# Patient Record
Sex: Female | Born: 1985 | Race: White | Hispanic: No | Marital: Single | Smoking: Current every day smoker
Health system: Southern US, Community
[De-identification: ages and names within clinical notes are randomized; demographics above are authoritative.]

## PROBLEM LIST (undated history)

## (undated) DIAGNOSIS — Z141 Cystic fibrosis carrier: Secondary | ICD-10-CM

## (undated) DIAGNOSIS — Z789 Other specified health status: Secondary | ICD-10-CM

## (undated) DIAGNOSIS — I10 Essential (primary) hypertension: Secondary | ICD-10-CM

## (undated) DIAGNOSIS — B009 Herpesviral infection, unspecified: Secondary | ICD-10-CM

## (undated) HISTORY — DX: Essential (primary) hypertension: I10

## (undated) HISTORY — DX: Cystic fibrosis carrier: Z14.1

## (undated) HISTORY — DX: Herpesviral infection, unspecified: B00.9

---

## 2005-04-15 ENCOUNTER — Emergency Department: Payer: Self-pay | Admitting: Emergency Medicine

## 2006-05-20 ENCOUNTER — Ambulatory Visit: Payer: Self-pay | Admitting: Gynecology

## 2006-05-25 ENCOUNTER — Ambulatory Visit (HOSPITAL_COMMUNITY): Admission: RE | Admit: 2006-05-25 | Discharge: 2006-05-25 | Payer: Self-pay

## 2006-06-06 ENCOUNTER — Ambulatory Visit: Payer: Self-pay | Admitting: Gynecology

## 2006-06-27 ENCOUNTER — Ambulatory Visit: Payer: Self-pay | Admitting: Gynecology

## 2006-07-25 ENCOUNTER — Ambulatory Visit: Payer: Self-pay | Admitting: Gynecology

## 2006-08-16 ENCOUNTER — Ambulatory Visit: Payer: Self-pay | Admitting: Family Medicine

## 2006-08-23 ENCOUNTER — Ambulatory Visit (HOSPITAL_COMMUNITY): Admission: RE | Admit: 2006-08-23 | Discharge: 2006-08-23 | Payer: Self-pay | Admitting: Gynecology

## 2006-09-13 ENCOUNTER — Ambulatory Visit: Payer: Self-pay | Admitting: Family Medicine

## 2006-10-11 ENCOUNTER — Ambulatory Visit: Payer: Self-pay | Admitting: Family Medicine

## 2006-10-19 ENCOUNTER — Ambulatory Visit: Payer: Self-pay | Admitting: Family Medicine

## 2006-11-01 ENCOUNTER — Ambulatory Visit: Payer: Self-pay | Admitting: Family Medicine

## 2006-11-15 ENCOUNTER — Ambulatory Visit: Payer: Self-pay | Admitting: Family Medicine

## 2006-11-29 ENCOUNTER — Ambulatory Visit: Payer: Self-pay | Admitting: Family Medicine

## 2006-12-21 ENCOUNTER — Ambulatory Visit: Payer: Self-pay | Admitting: Obstetrics & Gynecology

## 2006-12-27 ENCOUNTER — Ambulatory Visit: Payer: Self-pay | Admitting: Family Medicine

## 2007-01-01 ENCOUNTER — Inpatient Hospital Stay (HOSPITAL_COMMUNITY): Admission: AD | Admit: 2007-01-01 | Discharge: 2007-01-01 | Payer: Self-pay | Admitting: Gynecology

## 2007-01-02 ENCOUNTER — Inpatient Hospital Stay (HOSPITAL_COMMUNITY): Admission: AD | Admit: 2007-01-02 | Discharge: 2007-01-04 | Payer: Self-pay | Admitting: Gynecology

## 2007-01-02 ENCOUNTER — Ambulatory Visit: Payer: Self-pay | Admitting: Obstetrics & Gynecology

## 2007-02-07 ENCOUNTER — Ambulatory Visit: Payer: Self-pay | Admitting: Family Medicine

## 2007-02-07 ENCOUNTER — Encounter: Payer: Self-pay | Admitting: Family Medicine

## 2007-02-07 ENCOUNTER — Encounter (INDEPENDENT_AMBULATORY_CARE_PROVIDER_SITE_OTHER): Payer: Self-pay | Admitting: Specialist

## 2007-05-02 ENCOUNTER — Ambulatory Visit: Payer: Self-pay | Admitting: Family Medicine

## 2008-02-15 IMAGING — US US OB TRANSVAGINAL MODIFY
1 series · 18 of 28 positions shown · non-contrast
Comparison: none

CLINICAL DATA: Positive pregnancy test.  Dating and viability.

[Series 1: us ob transvaginal modify · 18 of 41 slices shown]
[im 1/41]
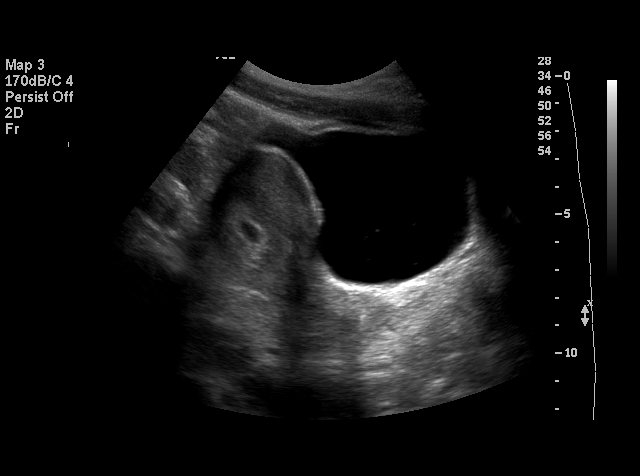
[im 3/41]
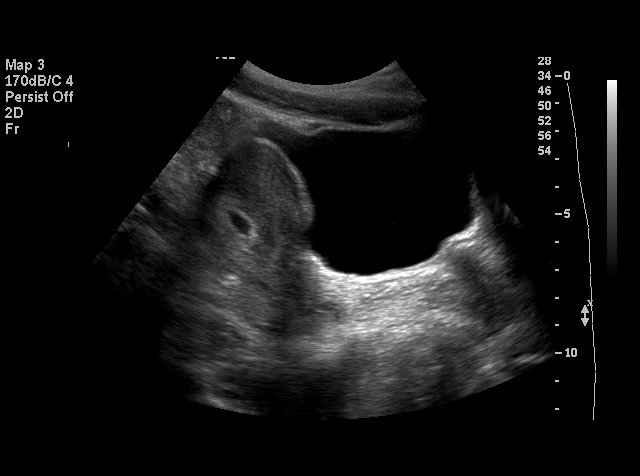
[im 5/41]
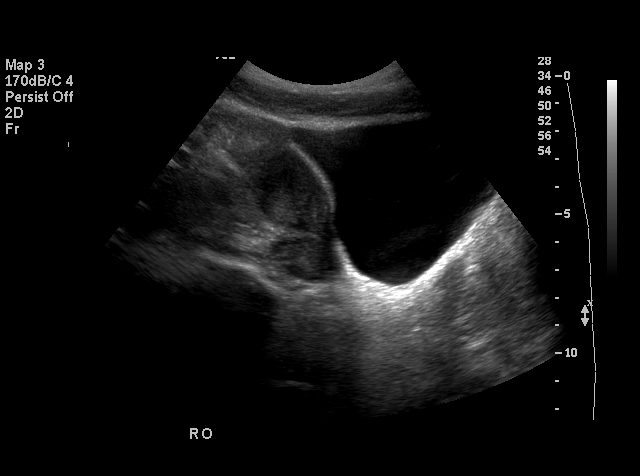
[im 8/41]
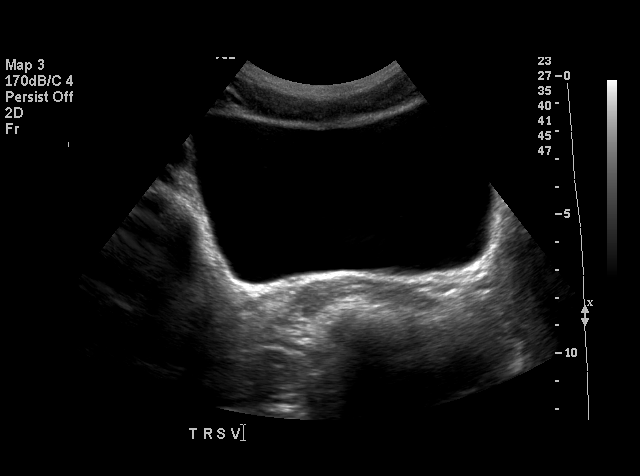
[im 11/41]
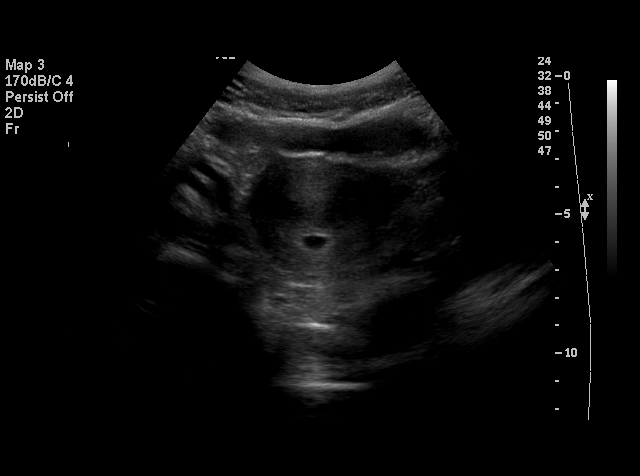
[im 12/41]
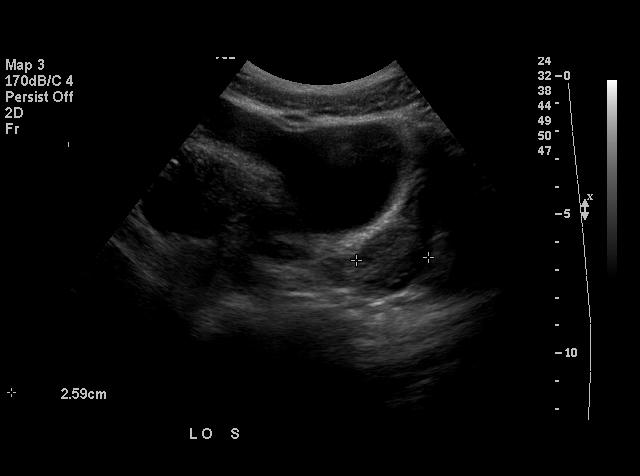
[im 15/41]
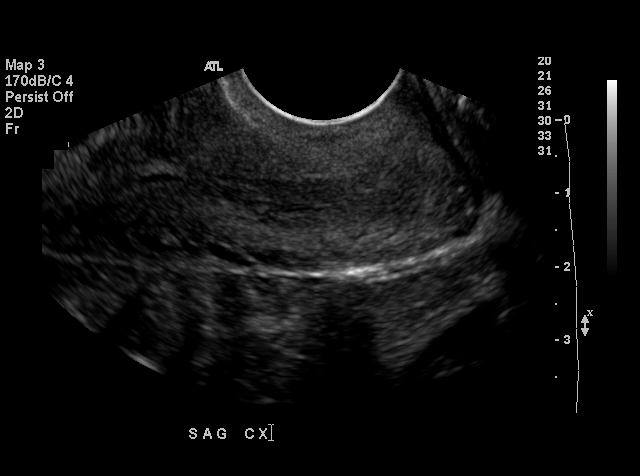
[im 17/41]
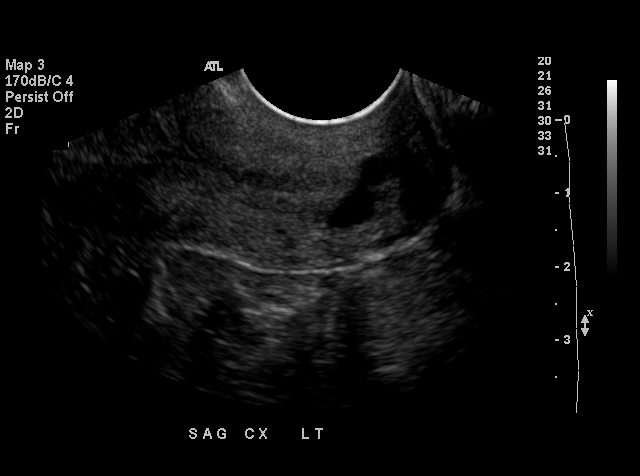
[im 20/41]
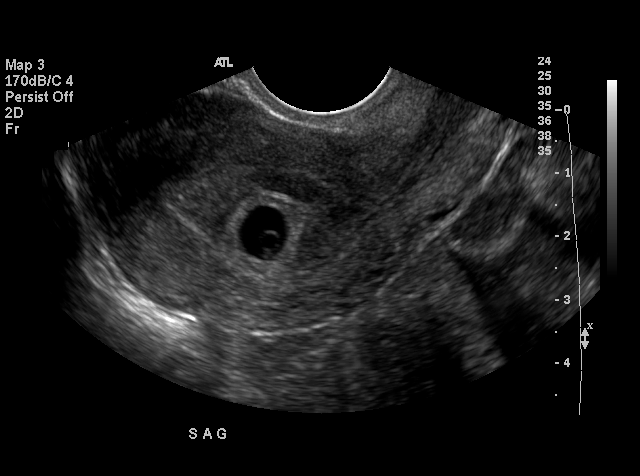
[im 21/41]
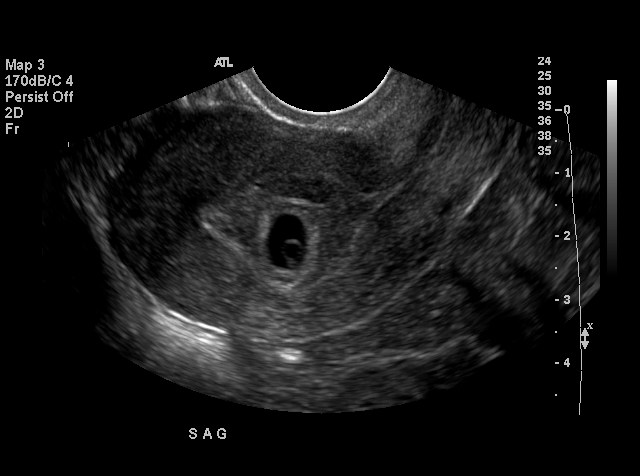
[im 24/41]
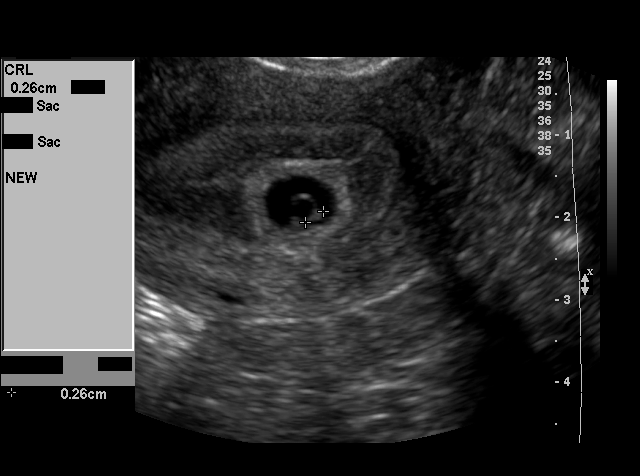
[im 26/41]
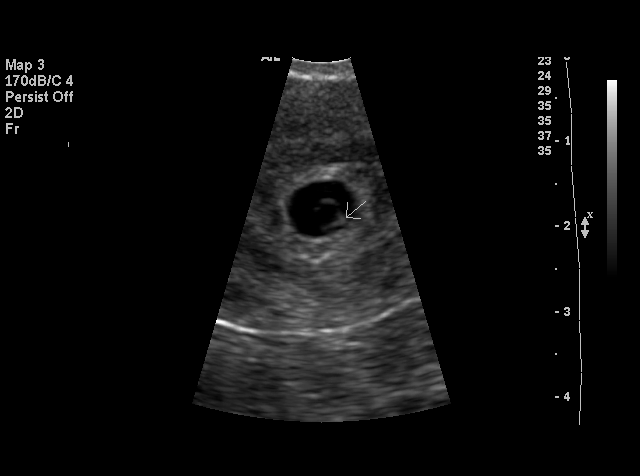
[im 29/41]
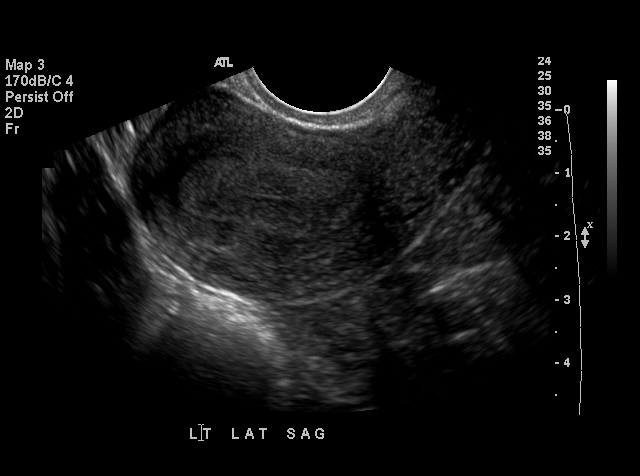
[im 32/41]
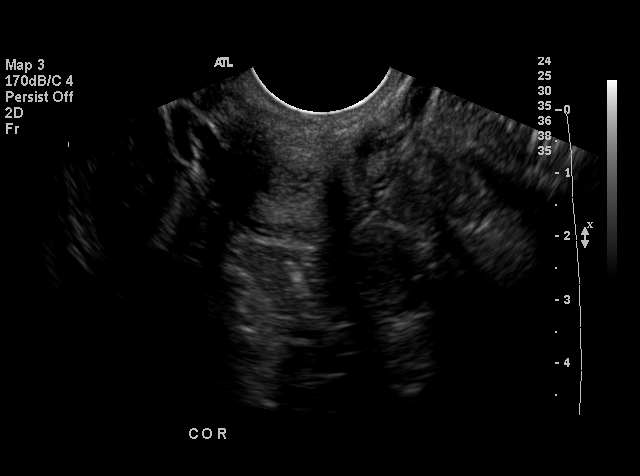
[im 33/41]
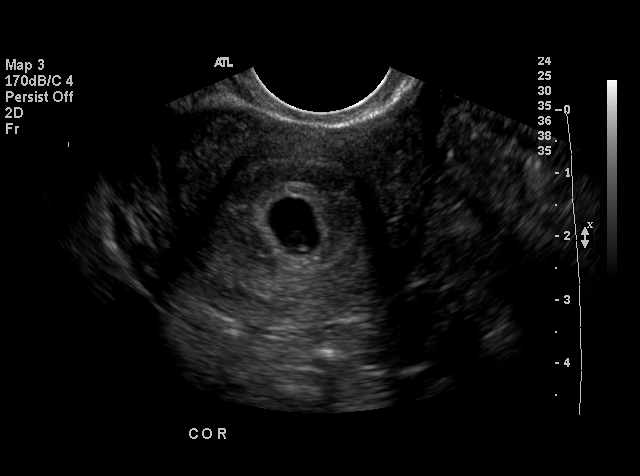
[im 36/41]
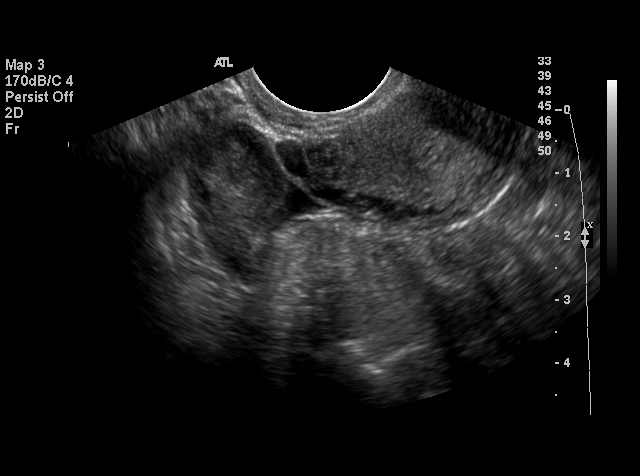
[im 38/41]
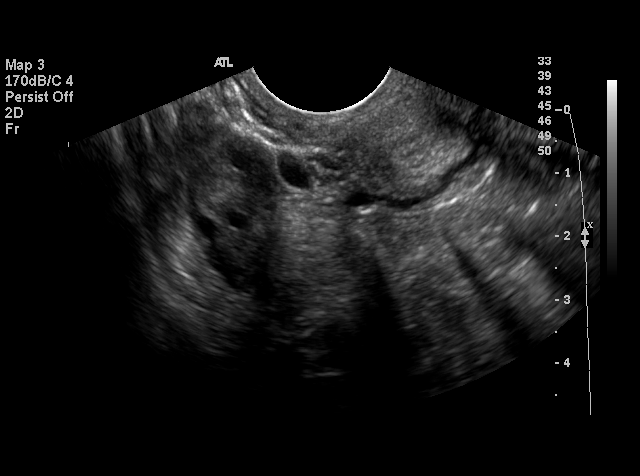
[im 41/41]
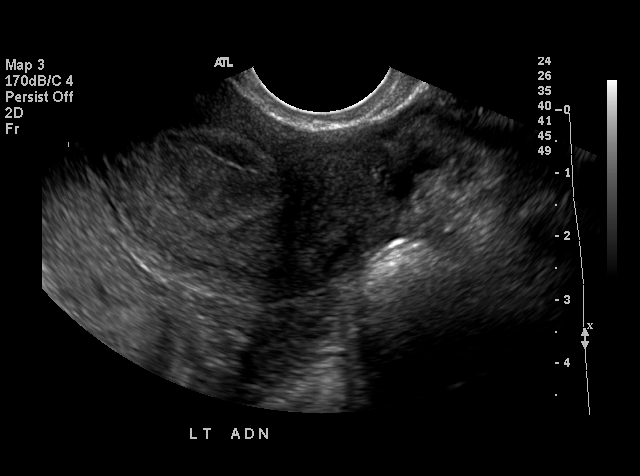

[18 of 28 positions shown; findings below may reference images not displayed]

OBSTETRICAL ULTRASOUND AND TRANSVAGINAL OB US:

 Number of Fetuses: 1
 Heart Rate:  100 bpm
 CRL:  .26 cm  5 w 6 d

 Ultrasound EDC:  01/19/07

 Fetal anatomy could not be evaluated due to the early gestational age.

 MATERNAL UTERINE AND ADNEXAL FINDINGS
 Cervix:  Closed.  Not evaluated < 16 weeks
IMPRESSION: 1.  Single living intrauterine gestation identified.  Crown rump length is 2.6 mm which estimates a 5 week 6 day gestational age.  
 2.  Ovaries are unremarkable.  There is no free fluid noted in the cul-de-sac.

## 2009-01-14 ENCOUNTER — Encounter: Payer: Self-pay | Admitting: Family Medicine

## 2009-01-14 ENCOUNTER — Ambulatory Visit: Payer: Self-pay | Admitting: Nurse Practitioner

## 2009-01-14 LAB — CONVERTED CEMR LAB
Antibody Screen: NEGATIVE
Basophils Absolute: 0 10*3/uL (ref 0.0–0.1)
Eosinophils Relative: 2 % (ref 0–5)
HCT: 43.9 % (ref 36.0–46.0)
Hepatitis B Surface Ag: NEGATIVE
Lymphocytes Relative: 28 % (ref 12–46)
MCV: 88.3 fL (ref 78.0–100.0)
RBC: 4.97 M/uL (ref 3.87–5.11)
Rh Type: POSITIVE
Rubella: 181.8 intl units/mL — ABNORMAL HIGH

## 2009-01-17 ENCOUNTER — Ambulatory Visit: Payer: Self-pay | Admitting: Family Medicine

## 2009-01-21 ENCOUNTER — Encounter: Payer: Self-pay | Admitting: Family Medicine

## 2009-01-28 ENCOUNTER — Encounter: Payer: Self-pay | Admitting: Family Medicine

## 2009-01-28 ENCOUNTER — Ambulatory Visit: Payer: Self-pay | Admitting: Nurse Practitioner

## 2009-01-28 LAB — CONVERTED CEMR LAB: hCG, Beta Chain, Quant, S: 946.1 milliintl units/mL

## 2009-01-30 ENCOUNTER — Encounter: Payer: Self-pay | Admitting: Family Medicine

## 2009-01-30 LAB — CONVERTED CEMR LAB: hCG, Beta Chain, Quant, S: 2814.6 milliintl units/mL

## 2009-02-06 ENCOUNTER — Ambulatory Visit: Payer: Self-pay | Admitting: Obstetrics & Gynecology

## 2009-02-21 ENCOUNTER — Ambulatory Visit (HOSPITAL_COMMUNITY): Admission: RE | Admit: 2009-02-21 | Discharge: 2009-02-21 | Payer: Self-pay | Admitting: Obstetrics and Gynecology

## 2009-03-06 ENCOUNTER — Ambulatory Visit: Payer: Self-pay | Admitting: Obstetrics and Gynecology

## 2009-04-09 ENCOUNTER — Ambulatory Visit: Payer: Self-pay | Admitting: Obstetrics and Gynecology

## 2009-04-09 ENCOUNTER — Encounter: Payer: Self-pay | Admitting: Family Medicine

## 2009-04-09 LAB — CONVERTED CEMR LAB
Hemoglobin: 13 g/dL (ref 12.0–15.0)
MCV: 90.8 fL (ref 78.0–100.0)
Platelets: 273 10*3/uL (ref 150–400)
RBC: 4.33 M/uL (ref 3.87–5.11)
RDW: 13.1 % (ref 11.5–15.5)
WBC: 12.5 10*3/uL — ABNORMAL HIGH (ref 4.0–10.5)

## 2009-04-29 ENCOUNTER — Ambulatory Visit (HOSPITAL_COMMUNITY): Admission: RE | Admit: 2009-04-29 | Discharge: 2009-04-29 | Payer: Self-pay | Admitting: Family Medicine

## 2009-05-05 ENCOUNTER — Ambulatory Visit: Payer: Self-pay | Admitting: Obstetrics & Gynecology

## 2009-05-05 ENCOUNTER — Encounter: Payer: Self-pay | Admitting: Family Medicine

## 2009-05-08 ENCOUNTER — Ambulatory Visit: Payer: Self-pay | Admitting: Obstetrics & Gynecology

## 2009-05-08 ENCOUNTER — Encounter: Payer: Self-pay | Admitting: Family Medicine

## 2009-06-03 ENCOUNTER — Ambulatory Visit: Payer: Self-pay | Admitting: Obstetrics & Gynecology

## 2009-07-02 ENCOUNTER — Ambulatory Visit: Payer: Self-pay | Admitting: Obstetrics & Gynecology

## 2009-07-02 ENCOUNTER — Encounter: Payer: Self-pay | Admitting: Family Medicine

## 2009-07-02 LAB — CONVERTED CEMR LAB
HCT: 35.5 % — ABNORMAL LOW (ref 36.0–46.0)
Hemoglobin: 11.5 g/dL — ABNORMAL LOW (ref 12.0–15.0)
MCHC: 32.4 g/dL (ref 30.0–36.0)
RBC: 3.85 M/uL — ABNORMAL LOW (ref 3.87–5.11)

## 2009-07-10 ENCOUNTER — Ambulatory Visit: Payer: Self-pay | Admitting: Obstetrics and Gynecology

## 2009-07-17 ENCOUNTER — Ambulatory Visit: Payer: Self-pay | Admitting: Obstetrics & Gynecology

## 2009-08-07 ENCOUNTER — Ambulatory Visit: Payer: Self-pay | Admitting: Obstetrics & Gynecology

## 2009-08-20 ENCOUNTER — Ambulatory Visit: Payer: Self-pay | Admitting: Family Medicine

## 2009-09-03 ENCOUNTER — Ambulatory Visit: Payer: Self-pay | Admitting: Obstetrics & Gynecology

## 2009-09-09 ENCOUNTER — Ambulatory Visit: Payer: Self-pay | Admitting: Obstetrics & Gynecology

## 2009-09-17 ENCOUNTER — Ambulatory Visit: Payer: Self-pay | Admitting: Obstetrics & Gynecology

## 2009-09-22 ENCOUNTER — Inpatient Hospital Stay (HOSPITAL_COMMUNITY): Admission: AD | Admit: 2009-09-22 | Discharge: 2009-09-24 | Payer: Self-pay | Admitting: Obstetrics & Gynecology

## 2009-09-22 ENCOUNTER — Ambulatory Visit: Payer: Self-pay | Admitting: Obstetrics & Gynecology

## 2009-09-22 ENCOUNTER — Ambulatory Visit: Payer: Self-pay | Admitting: Obstetrics and Gynecology

## 2011-03-25 LAB — COMPREHENSIVE METABOLIC PANEL
ALT: 10 U/L (ref 0–35)
AST: 17 U/L (ref 0–37)
CO2: 20 mEq/L (ref 19–32)
GFR calc Af Amer: >60 mL/min (ref >60)
Potassium: 4.1 mEq/L (ref 3.5–5.1)
Total Bilirubin: 0.4 mg/dL (ref 0.3–1.2)
Total Protein: 5.5 g/dL — ABNORMAL LOW (ref 6.0–8.3)

## 2011-03-25 LAB — CBC
HCT: 32.9 % — ABNORMAL LOW (ref 36.0–46.0)
Hemoglobin: 11.1 g/dL — ABNORMAL LOW (ref 12.0–15.0)
MCHC: 33.3 g/dL (ref 30.0–36.0)
MCHC: 33.6 g/dL (ref 30.0–36.0)
Platelets: 224 10*3/uL (ref 150–400)
RBC: 3.78 MIL/uL — ABNORMAL LOW (ref 3.87–5.11)
RBC: 4.15 MIL/uL (ref 3.87–5.11)
RDW: 13.4 % (ref 11.5–15.5)

## 2011-03-25 LAB — RPR: RPR Ser Ql: NONREACTIVE

## 2011-03-25 LAB — URINALYSIS, DIPSTICK ONLY
Glucose, UA: NEGATIVE mg/dL
Protein, ur: NEGATIVE mg/dL
pH: 7.5 (ref 5.0–8.0)

## 2011-05-04 NOTE — Assessment & Plan Note (Signed)
Whitney Rodriguez, Whitney Rodriguez              ACCOUNT NO.:  [id number]   MEDICAL RECORD NO.:  [id number]          PATIENT TYPE:  POB   LOCATION:  [facility] at [facility]         FACILITY:  [facility]   PHYSICIAN:  Tinnie Gens, MD        DATE OF BIRTH:  August 12, 1986   DATE OF SERVICE:  02/07/2007                                  CLINIC NOTE   HISTORY OF PRESENT ILLNESS:  The patient is a 25 year old gravida 3,  para 1-0-2-1 __________ vaginal delivery of a female infant, 7 pounds 3  ounces.  Her delivery went well.  She is nursing.  She is supposed to go  back to work on Monday.  I gave her birth control; she is not sexually  active.  Her mood is good.  Weight loss has been approximately 23  pounds; she has about 25 to go.  The patient has joined a gym; it seems  to be working out and she feels well.  She has not had a cycle since  delivery.  Her last Pap was in 2006.   PHYSICAL EXAMINATION:  On exam, her vitals are as noted in the chart.  She is a well-developed, well-nourished female in no acute distress.  ABDOMEN:  Soft, nontender, nondistended.  GU:  Normal external genitalia.  Vagina is pink and rugated.  Cervix is  parous, without lesion.  The cervix is closed.  The uterus is small and  inverted.  Adnexa without mass or tenderness.   IMPRESSION:  1. Postpartum check.  2. Breast feeding.   PLAN:  1. Pap smear today.  Start oral contraceptives.  2. An outlined discussion was had with the patient regarding birth      control between IUD, Nuvaring, Ortho Evra.  The patient seems to      understand all of this and would like to start with combination      oral contraceptives, but will let us know if she changes her mind      about that.  3. Also discussed continued breast feeding as an implication of her      health of her and her baby.           ______________________________  Tinnie Gens, MD     TP/MEDQ  D:  02/07/2007  T:  02/07/2007  Job:  [id number]

## 2012-12-20 NOTE — L&D Delivery Note (Signed)
Delivery Note At 2:41 AM a viable female was delivered via Vaginal, Spontaneous Delivery (Presentation: Middle Occiput Anterior).  APGAR: 7, 9; weight pending .   Placenta status: Intact, Spontaneous.  Cord: 3 vessels with the following complications: None.  Cord pH: sent  Anesthesia: Spinal  Episiotomy: None Lacerations: Periurethral Suture Repair: none Est. Blood Loss (mL): 250  Mom to postpartum.  Baby to nursery-stable. Pt permitted to labor down with good effort. Delivered a viable baby boy after 2 hard pushes with instantaneous cry. Placenta was spontaneously delivered after manual traction. Delivery was complicated by periurethral tear that was hemostatic. After discussion with pt, plan to allow skin to reapproximate without use of suture. EBL approx 250. Pt and baby both hemodynamically stable for postpartum transfer.   Anselm Lis 10/09/2013, 3:45 AM  I have seen and examined this patient and agree with above documentation in the resident's note. Pt desires postpartum tubal and 30 day papers are signed. OR notified and will post the case.   Rulon Abide, M.D. [facility] Fellow 10/09/2013 3:58 AM

## 2013-03-08 ENCOUNTER — Encounter: Payer: Self-pay | Admitting: *Deleted

## 2013-03-08 ENCOUNTER — Ambulatory Visit (INDEPENDENT_AMBULATORY_CARE_PROVIDER_SITE_OTHER): Payer: Commercial Indemnity | Admitting: *Deleted

## 2013-03-08 VITALS — BP 125/85 | Ht 61.0 in | Wt 150.0 lb

## 2013-03-08 DIAGNOSIS — Z3481 Encounter for supervision of other normal pregnancy, first trimester: Secondary | ICD-10-CM

## 2013-03-08 DIAGNOSIS — Z3480 Encounter for supervision of other normal pregnancy, unspecified trimester: Secondary | ICD-10-CM

## 2013-03-09 LAB — OBSTETRIC PANEL
Basophils Relative: 0 % (ref 0–1)
Hemoglobin: 16 g/dL — ABNORMAL HIGH (ref 12.0–15.0)
Hepatitis B Surface Ag: NEGATIVE
Lymphs Abs: 2 10*3/uL (ref 0.7–4.0)
MCH: 30.9 pg (ref 26.0–34.0)
MCHC: 34.8 g/dL (ref 30.0–36.0)
MCV: 88.8 fL (ref 78.0–100.0)
Monocytes Absolute: 0.8 10*3/uL (ref 0.1–1.0)
Monocytes Relative: 10 % (ref 3–12)
Neutrophils Relative %: 62 % (ref 43–77)
Platelets: 288 10*3/uL (ref 150–400)
RBC: 5.18 MIL/uL — ABNORMAL HIGH (ref 3.87–5.11)
RDW: 13.6 % (ref 11.5–15.5)
Rubella: 5.52 Index — ABNORMAL HIGH (ref <0.90)
WBC: 7.4 10*3/uL (ref 4.0–10.5)

## 2013-03-10 LAB — CULTURE, OB URINE: Colony Count: 70000

## 2013-03-13 ENCOUNTER — Telehealth: Payer: Self-pay | Admitting: *Deleted

## 2013-03-13 NOTE — Telephone Encounter (Signed)
Message copied by Barbara Cower on Tue Mar 13, 2013  4:56 PM ------      Message from: Reva Bores      Created: Thu Mar 08, 2013  4:27 PM      Regarding: RE: Patient advice       Was pt. Tested?  If she is neg, should not need anti-virals.  I have never been offered them when I had a needle stick, unless this is new.  We do use them for HIV pt's during pregnancy, as benefit outweighs risk.  If pt. Is negative, then I think she has a very low chance of developing any problems.            ----- Message -----         From: Barbara Cower, CMA         Sent: 03/08/2013   3:14 PM           To: Reva Bores, MD      Subject: Patient advice                                           Patient is here today for new ob.  She works for a [employer] in [place] and was exposed to a needle stick at her work from a low risk patient having a procedure.  She had all the labs yesterday at the time of the incident and they told her she needed to check with her ob regarding taking antivirals risks vs benefits and do you recommend her taking them or not.  Please advise!       ------

## 2013-03-13 NOTE — Telephone Encounter (Signed)
Patient was notified of recommendation, she is comfortable with this.

## 2013-03-27 ENCOUNTER — Encounter: Payer: Self-pay | Admitting: Family Medicine

## 2013-03-27 ENCOUNTER — Ambulatory Visit (INDEPENDENT_AMBULATORY_CARE_PROVIDER_SITE_OTHER): Payer: Commercial Indemnity | Admitting: Family Medicine

## 2013-03-27 VITALS — BP 118/81 | Wt 154.0 lb

## 2013-03-27 DIAGNOSIS — Z3481 Encounter for supervision of other normal pregnancy, first trimester: Secondary | ICD-10-CM

## 2013-03-27 DIAGNOSIS — Z87891 Personal history of nicotine dependence: Secondary | ICD-10-CM

## 2013-03-27 DIAGNOSIS — Z124 Encounter for screening for malignant neoplasm of cervix: Secondary | ICD-10-CM

## 2013-03-27 DIAGNOSIS — Z141 Cystic fibrosis carrier: Secondary | ICD-10-CM

## 2013-03-27 DIAGNOSIS — O0993 Supervision of high risk pregnancy, unspecified, third trimester: Secondary | ICD-10-CM | POA: Insufficient documentation

## 2013-03-27 DIAGNOSIS — Z3480 Encounter for supervision of other normal pregnancy, unspecified trimester: Secondary | ICD-10-CM

## 2013-03-27 DIAGNOSIS — Z113 Encounter for screening for infections with a predominantly sexual mode of transmission: Secondary | ICD-10-CM

## 2013-03-27 NOTE — Progress Notes (Signed)
Patient is having increased nausea throughout the day, worse in the afternoons.  She had heard there was something new that might help as otc remedies and phenergan and zofran gave little to no relief her last pregnancy.

## 2013-03-27 NOTE — Patient Instructions (Signed)
Pregnancy - First Trimester During sexual intercourse, millions of sperm go into the vagina. Only 1 sperm will penetrate and fertilize the female egg while it is in the Fallopian tube. One week later, the fertilized egg implants into the wall of the uterus. An embryo begins to develop into a baby. At 6 to 8 weeks, the eyes and face are formed and the heartbeat can be seen on ultrasound. At the end of 12 weeks (first trimester), all the baby's organs are formed. Now that you are pregnant, you will want to do everything you can to have a healthy baby. Two of the most important things are to get good prenatal care and follow your caregiver's instructions. Prenatal care is all the medical care you receive before the baby's birth. It is given to prevent, find, and treat problems during the pregnancy and childbirth. PRENATAL EXAMS  During prenatal visits, your weight, blood pressure and urine are checked. This is done to make sure you are healthy and progressing normally during the pregnancy.  A pregnant woman should gain 25 to 35 pounds during the pregnancy. However, if you are over weight or underweight, your caregiver will advise you regarding your weight.  Your caregiver will ask and answer questions for you.  Blood work, cervical cultures, other necessary tests and a Pap test are done during your prenatal exams. These tests are done to check on your health and the probable health of your baby. Tests are strongly recommended and done for HIV with your permission. This is the virus that causes AIDS. These tests are done because medications can be given to help prevent your baby from being born with this infection should you have been infected without knowing it. Blood work is also used to find out your blood type, previous infections and follow your blood levels (hemoglobin).  Low hemoglobin (anemia) is common during pregnancy. Iron and vitamins are given to help prevent this. Later in the pregnancy,  blood tests for diabetes will be done along with any other tests if any problems develop. You may need tests to make sure you and the baby are doing well.  You may need other tests to make sure you and the baby are doing well. CHANGES DURING THE FIRST TRIMESTER (THE FIRST 3 MONTHS OF PREGNANCY) Your body goes through many changes during pregnancy. They vary from person to person. Talk to your caregiver about changes you notice and are concerned about. Changes can include:  Your menstrual period stops.  The egg and sperm carry the genes that determine what you look like. Genes from you and your partner are forming a baby. The female genes determine whether the baby is a boy or a girl.  Your body increases in girth and you may feel bloated.  Feeling sick to your stomach (nauseous) and throwing up (vomiting). If the vomiting is uncontrollable, call your caregiver.  Your breasts will begin to enlarge and become tender.  Your nipples may stick out more and become darker.  The need to urinate more. Painful urination may mean you have a bladder infection.  Tiring easily.  Loss of appetite.  Cravings for certain kinds of food.  At first, you may gain or lose a couple of pounds.  You may have changes in your emotions from day to day (excited to be pregnant or concerned something may go wrong with the pregnancy and baby).  You may have more vivid and strange dreams. HOME CARE INSTRUCTIONS   It is very important   to avoid all smoking, alcohol and un-prescribed drugs during your pregnancy. These affect the formation and growth of the baby. Avoid chemicals while pregnant to ensure the delivery of a healthy infant.  Start your prenatal visits by the 12th week of pregnancy. They are usually scheduled monthly at first, then more often in the last 2 months before delivery. Keep your caregiver's appointments. Follow your caregiver's instructions regarding medication use, blood and lab tests, exercise,  and diet.  During pregnancy, you are providing food for you and your baby. Eat regular, well-balanced meals. Choose foods such as meat, fish, milk and other low fat dairy products, vegetables, fruits, and whole-grain breads and cereals. Your caregiver will tell you of the ideal weight gain.  You can help morning sickness by keeping soda crackers at the bedside. Eat a couple before arising in the morning. You may want to use the crackers without salt on them.  Eating 4 to 5 small meals rather than 3 large meals a day also may help the nausea and vomiting.  Drinking liquids between meals instead of during meals also seems to help nausea and vomiting.  A physical sexual relationship may be continued throughout pregnancy if there are no other problems. Problems may be early (premature) leaking of amniotic fluid from the membranes, vaginal bleeding, or belly (abdominal) pain.  Exercise regularly if there are no restrictions. Check with your caregiver or physical therapist if you are unsure of the safety of some of your exercises. Greater weight gain will occur in the last 2 trimesters of pregnancy. Exercising will help:  Control your weight.  Keep you in shape.  Prepare you for labor and delivery.  Help you lose your pregnancy weight after you deliver your baby.  Wear a good support or jogging bra for breast tenderness during pregnancy. This may help if worn during sleep too.  Ask when prenatal classes are available. Begin classes when they are offered.  Do not use hot tubs, steam rooms or saunas.  Wear your seat belt when driving. This protects you and your baby if you are in an accident.  Avoid raw meat, uncooked cheese, cat litter boxes and soil used by cats throughout the pregnancy. These carry germs that can cause birth defects in the baby.  The first trimester is a good time to visit your dentist for your dental health. Getting your teeth cleaned is OK. Use a softer toothbrush and  brush gently during pregnancy.  Ask for help if you have financial, counseling or nutritional needs during pregnancy. Your caregiver will be able to offer counseling for these needs as well as refer you for other special needs.  Do not take any medications or herbs unless told by your caregiver.  Inform your caregiver if there is any mental or physical domestic violence.  Make a list of emergency phone numbers of family, friends, hospital, and police and fire departments.  Write down your questions. Take them to your prenatal visit.  Do not douche.  Do not cross your legs.  If you have to stand for long periods of time, rotate you feet or take small steps in a circle.  You may have more vaginal secretions that may require a sanitary pad. Do not use tampons or scented sanitary pads. MEDICATIONS AND DRUG USE IN PREGNANCY  Take prenatal vitamins as directed. The vitamin should contain 1 milligram of folic acid. Keep all vitamins out of reach of children. Only a couple vitamins or tablets containing iron may be   fatal to a baby or young child when ingested.  Avoid use of all medications, including herbs, over-the-counter medications, not prescribed or suggested by your caregiver. Only take over-the-counter or prescription medicines for pain, discomfort, or fever as directed by your caregiver. Do not use aspirin, ibuprofen, or naproxen unless directed by your caregiver.  Let your caregiver also know about herbs you may be using.  Alcohol is related to a number of birth defects. This includes fetal alcohol syndrome. All alcohol, in any form, should be avoided completely. Smoking will cause low birth rate and premature babies.  Street or illegal drugs are very harmful to the baby. They are absolutely forbidden. A baby born to an addicted mother will be addicted at birth. The baby will go through the same withdrawal an adult does.  Let your caregiver know about any medications that you have to  take and for what reason you take them. MISCARRIAGE IS COMMON DURING PREGNANCY A miscarriage does not mean you did something wrong. It is not a reason to worry about getting pregnant again. Your caregiver will help you with questions you may have. If you have a miscarriage, you may need minor surgery. SEEK MEDICAL CARE IF:  You have any concerns or worries during your pregnancy. It is better to call with your questions if you feel they cannot wait, rather than worry about them. SEEK IMMEDIATE MEDICAL CARE IF:   An unexplained oral temperature above 102 F (38.9 C) develops, or as your caregiver suggests.  You have leaking of fluid from the vagina (birth canal). If leaking membranes are suspected, take your temperature and inform your caregiver of this when you call.  There is vaginal spotting or bleeding. Notify your caregiver of the amount and how many pads are used.  You develop a bad smelling vaginal discharge with a change in the color.  You continue to feel sick to your stomach (nauseated) and have no relief from remedies suggested. You vomit blood or coffee ground-like materials.  You lose more than 2 pounds of weight in 1 week.  You gain more than 2 pounds of weight in 1 week and you notice swelling of your face, hands, feet, or legs.  You gain 5 pounds or more in 1 week (even if you do not have swelling of your hands, face, legs, or feet).  You get exposed to German measles and have never had them.  You are exposed to fifth disease or chickenpox.  You develop belly (abdominal) pain. Round ligament discomfort is a common non-cancerous (benign) cause of abdominal pain in pregnancy. Your caregiver still must evaluate this.  You develop headache, fever, diarrhea, pain with urination, or shortness of breath.  You fall or are in a car accident or have any kind of trauma.  There is mental or physical violence in your home. Document Released: 11/30/2001 Document Revised: 02/28/2012  Document Reviewed: 06/03/2009 Exit[employer] Patient Information 2013 [employer].  Breastfeeding Deciding to breastfeed is one of the best choices you can make for you and your baby. The information that follows gives a brief overview of the benefits of breastfeeding as well as common topics surrounding breastfeeding. BENEFITS OF BREASTFEEDING For the baby  The first milk (colostrum) helps the baby's digestive system function better.   There are antibodies in the mother's milk that help the baby fight off infections.   The baby has a lower incidence of asthma, allergies, and sudden infant death syndrome (SIDS).   The nutrients in breast milk   are better for the baby than infant formulas, and breast milk helps the baby's brain grow better.   Babies who breastfeed have less gas, colic, and constipation.  For the mother  Breastfeeding helps develop a very special bond between the mother and her baby.   Breastfeeding is convenient, always available at the correct temperature, and costs nothing.   Breastfeeding burns calories in the mother and helps her lose weight that was gained during pregnancy.   Breastfeeding makes the uterus contract back down to normal size faster and slows bleeding following delivery.   Breastfeeding mothers have a lower risk of developing breast cancer.  BREASTFEEDING FREQUENCY  A healthy, full-term baby may breastfeed as often as every hour or space his or her feedings to every 3 hours.   Watch your baby for signs of hunger. Nurse your baby if he or she shows signs of hunger. How often you nurse will vary from baby to baby.   Nurse as often as the baby requests, or when you feel the need to reduce the fullness of your breasts.   Awaken the baby if it has been 3 4 hours since the last feeding.   Frequent feeding will help the mother make more milk and will help prevent problems, such as sore nipples and engorgement of the breasts.  BABY'S  POSITION AT THE BREAST  Whether lying down or sitting, be sure that the baby's tummy is facing your tummy.   Support the breast with 4 fingers underneath the breast and the thumb above. Make sure your fingers are well away from the nipple and baby's mouth.   Stroke the baby's lips gently with your finger or nipple.   When the baby's mouth is open wide enough, place all of your nipple and as much of the areola as possible into your baby's mouth.   Pull the baby in close so the tip of the nose and the baby's cheeks touch the breast during the feeding.  FEEDINGS AND SUCTION  The length of each feeding varies from baby to baby and from feeding to feeding.   The baby must suck about 2 3 minutes for your milk to get to him or her. This is called a "let down." For this reason, allow the baby to feed on each breast as long as he or she wants. Your baby will end the feeding when he or she has received the right balance of nutrients.   To break the suction, put your finger into the corner of the baby's mouth and slide it between his or her gums before removing your breast from his or her mouth. This will help prevent sore nipples.  HOW TO TELL WHETHER YOUR BABY IS GETTING ENOUGH BREAST MILK. Wondering whether or not your baby is getting enough milk is a common concern among mothers. You can be assured that your baby is getting enough milk if:   Your baby is actively sucking and you hear swallowing.   Your baby seems relaxed and satisfied after a feeding.   Your baby nurses at least 8 12 times in a 24 hour time period. Nurse your baby until he or she unlatches or falls asleep at the first breast (at least 10 20 minutes), then offer the second side.   Your baby is wetting 5 6 disposable diapers (6 8 cloth diapers) in a 24 hour period by 5 6 days of age.   Your baby is having at least 3 4 stools every 24 hours   for the first 6 weeks. The stool should be soft and yellow.   Your baby  should gain 4 7 ounces per week after he or she is 4 days old.   Your breasts feel softer after nursing.  REDUCING BREAST ENGORGEMENT  In the first week after your baby is born, you may experience signs of breast engorgement. When breasts are engorged, they feel heavy, warm, full, and may be tender to the touch. You can reduce engorgement if you:   Nurse frequently, every 2 3 hours. Mothers who breastfeed early and often have fewer problems with engorgement.   Place light ice packs on your breasts for 10 20 minutes between feedings. This reduces swelling. Wrap the ice packs in a lightweight towel to protect your skin. Bags of frozen vegetables work well for this purpose.   Take a warm shower or apply warm, moist heat to your breast for 5 10 minutes just before each feeding. This increases circulation and helps the milk flow.   Gently massage your breast before and during the feeding. Using your finger tips, massage from the chest wall towards your nipple in a circular motion.   Make sure that the baby empties at least one breast at every feeding before switching sides.   Use a breast pump to empty the breasts if your baby is sleepy or not nursing well. You may also want to pump if you are returning to work oryou feel you are getting engorged.   Avoid bottle feeds, pacifiers, or supplemental feedings of water or juice in place of breastfeeding. Breast milk is all the food your baby needs. It is not necessary for your baby to have water or formula. In fact, to help your breasts make more milk, it is best not to give your baby supplemental feedings during the early weeks.   Be sure the baby is latched on and positioned properly while breastfeeding.   Wear a supportive bra, avoiding underwire styles.   Eat a balanced diet with enough fluids.   Rest often, relax, and take your prenatal vitamins to prevent fatigue, stress, and anemia.  If you follow these suggestions, your  engorgement should improve in 24 48 hours. If you are still experiencing difficulty, call your lactation consultant or caregiver.  CARING FOR YOURSELF Take care of your breasts  Bathe or shower daily.   Avoid using soap on your nipples.   Start feedings on your left breast at one feeding and on your right breast at the next feeding.   You will notice an increase in your milk supply 2 5 days after delivery. You may feel some discomfort from engorgement, which makes your breasts very firm and often tender. Engorgement "peaks" out within 24 48 hours. In the meantime, apply warm moist towels to your breasts for 5 10 minutes before feeding. Gentle massage and expression of some milk before feeding will soften your breasts, making it easier for your baby to latch on.   Wear a well-fitting nursing bra, and air dry your nipples for a 3 4minutes after each feeding.   Only use cotton bra pads.   Only use pure lanolin on your nipples after nursing. You do not need to wash it off before feeding the baby again. Another option is to express a few drops of breast milk and gently massage it into your nipples.  Take care of yourself  Eat well-balanced meals and nutritious snacks.   Drinking milk, fruit juice, and water to satisfy your thirst (  about 8 glasses a day).   Get plenty of rest.  Avoid foods that you notice affect the baby in a bad way.  SEEK MEDICAL CARE IF:   You have difficulty with breastfeeding and need help.   You have a hard, red, sore area on your breast that is accompanied by a fever.   Your baby is too sleepy to eat well or is having trouble sleeping.   Your baby is wetting less than 6 diapers a day, by 5 days of age.   Your baby's skin or white part of his or her eyes is more yellow than it was in the hospital.   You feel depressed.  Document Released: 12/06/2005 Document Revised: 06/06/2012 Document Reviewed: 03/05/2012 [employer] Patient Information 2013  [employer].  

## 2013-03-27 NOTE — Progress Notes (Signed)
   Subjective:    Whitney Rodriguez is a [id number] [redacted]w[redacted]d being seen today for her first obstetrical visit.  Her obstetrical history is significant for two previous term SVD's.  Recently quit smoking.. Patient does intend to breast feed. Pregnancy history fully reviewed.  Patient reports nausea.  Filed Vitals:   03/27/13 1559  BP: 118/81  Weight: 154 lb (69.854 kg)    HISTORY: OB History   Grav Para Term Preterm Abortions TAB SAB Ect Mult Living   5 2 2  0 2 2 0 0 0 2     # Outc Date GA Lbr Len/2nd Wgt Sex Del Anes PTL Lv   1 TRM 1/08 [redacted]w[redacted]d  7lb3oz(3.26kg) F SVD EPI No Yes   2 TRM 10/10 [redacted]w[redacted]d  7lb1oz(3.204kg) M SVD EPI No Yes   3 TAB            4 TAB            5 CUR              Past Medical History  Diagnosis Date  . Cystic fibrosis gene carrier     fob is negative for gene   History reviewed. No pertinent past surgical history. History reviewed. No pertinent family history.   Exam    Uterus:     Pelvic Exam:    Perineum: Normal Perineum   Vulva: Bartholin's, Urethra, Skene's normal   Vagina:  normal mucosa   Cervix: multiparous appearance, no cervical motion tenderness and no lesions   Adnexa: normal adnexa   Bony Pelvis: average  System: Breast:  normal appearance, no masses or tenderness   Skin: normal coloration and turgor, no rashes    Neurologic: oriented   Extremities: normal strength, tone, and muscle mass   HEENT sclera clear, anicteric   Mouth/Teeth mucous membranes moist, pharynx normal without lesions   Neck supple   Cardiovascular: regular rate and rhythm, no murmurs or gallops   Respiratory:  appears well, vitals normal, no respiratory distress, acyanotic, normal RR, ear and throat exam is normal, neck free of mass or lymphadenopathy, chest clear, no wheezing, crepitations, rhonchi, normal symmetric air entry   Abdomen: soft, non-tender; bowel sounds normal; no masses,  no organomegaly      Assessment:    Pregnancy: [id number] Patient Active Problem  List  Diagnosis  . Supervision of other normal pregnancy  . Cystic fibrosis carrier        Plan:     Initial labs drawn. Prenatal vitamins. Problem list reviewed and updated. New OB labs reviewed and WNL Genetic Screening discussed First Screen: ordered. Ultrasound discussed; fetal survey: discussed. Follow up in 4 weeks. Pap smear today.     PRATT,TANYA S 03/27/2013

## 2013-03-28 ENCOUNTER — Encounter: Payer: Self-pay | Admitting: Family Medicine

## 2013-03-28 DIAGNOSIS — Z87891 Personal history of nicotine dependence: Secondary | ICD-10-CM | POA: Insufficient documentation

## 2013-04-24 ENCOUNTER — Encounter: Payer: Self-pay | Admitting: Obstetrics and Gynecology

## 2013-04-24 ENCOUNTER — Ambulatory Visit (INDEPENDENT_AMBULATORY_CARE_PROVIDER_SITE_OTHER): Payer: Commercial Indemnity | Admitting: Obstetrics and Gynecology

## 2013-04-24 VITALS — BP 126/80 | Wt 157.0 lb

## 2013-04-24 DIAGNOSIS — Z87891 Personal history of nicotine dependence: Secondary | ICD-10-CM

## 2013-04-24 DIAGNOSIS — Z3480 Encounter for supervision of other normal pregnancy, unspecified trimester: Secondary | ICD-10-CM

## 2013-04-24 DIAGNOSIS — Z141 Cystic fibrosis carrier: Secondary | ICD-10-CM

## 2013-04-24 DIAGNOSIS — Z3482 Encounter for supervision of other normal pregnancy, second trimester: Secondary | ICD-10-CM

## 2013-04-24 NOTE — Progress Notes (Signed)
Patient doing well without complaints. Did not get first screen done. Patient declines Quad screen at this time for financial reasons and may reconsider pending anatomy ultrasound.

## 2013-05-22 ENCOUNTER — Encounter: Payer: Commercial Indemnity | Admitting: Family Medicine

## 2013-06-18 ENCOUNTER — Ambulatory Visit (INDEPENDENT_AMBULATORY_CARE_PROVIDER_SITE_OTHER): Payer: Commercial Indemnity | Admitting: Family Medicine

## 2013-06-18 ENCOUNTER — Encounter: Payer: Self-pay | Admitting: Family Medicine

## 2013-06-18 VITALS — BP 109/80 | Wt 165.0 lb

## 2013-06-18 DIAGNOSIS — Z3482 Encounter for supervision of other normal pregnancy, second trimester: Secondary | ICD-10-CM

## 2013-06-18 DIAGNOSIS — Z3480 Encounter for supervision of other normal pregnancy, unspecified trimester: Secondary | ICD-10-CM

## 2013-06-18 NOTE — Progress Notes (Signed)
Doing well.  Having trouble with insurance, we are out of network for her.  Having trouble with [facility].  Needs anatomy. She will call her insurance to sort out.

## 2013-06-18 NOTE — Patient Instructions (Signed)
Pregnancy - Second Trimester The second trimester of pregnancy (3 to 6 months) is a period of rapid growth for you and your baby. At the end of the sixth month, your baby is about 9 inches long and weighs 1 1/2 pounds. You will begin to feel the baby move between 18 and 20 weeks of the pregnancy. This is called quickening. Weight gain is faster. A clear fluid (colostrum) may leak out of your breasts. You may feel small contractions of the womb (uterus). This is known as false labor or Braxton-Hicks contractions. This is like a practice for labor when the baby is ready to be born. Usually, the problems with morning sickness have usually passed by the end of your first trimester. Some women develop small dark blotches (called cholasma, mask of pregnancy) on their face that usually goes away after the baby is born. Exposure to the sun makes the blotches worse. Acne may also develop in some pregnant women and pregnant women who have acne, may find that it goes away. PRENATAL EXAMS  Blood work may continue to be done during prenatal exams. These tests are done to check on your health and the probable health of your baby. Blood work is used to follow your blood levels (hemoglobin). Anemia (low hemoglobin) is common during pregnancy. Iron and vitamins are given to help prevent this. You will also be checked for diabetes between 24 and 28 weeks of the pregnancy. Some of the previous blood tests may be repeated.  The size of the uterus is measured during each visit. This is to make sure that the baby is continuing to grow properly according to the dates of the pregnancy.  Your blood pressure is checked every prenatal visit. This is to make sure you are not getting toxemia.  Your urine is checked to make sure you do not have an infection, diabetes or protein in the urine.  Your weight is checked often to make sure gains are happening at the suggested rate. This is to ensure that both you and your baby are  growing normally.  Sometimes, an ultrasound is performed to confirm the proper growth and development of the baby. This is a test which bounces harmless sound waves off the baby so your caregiver can more accurately determine due dates. Sometimes, a test is done on the amniotic fluid surrounding the baby. This test is called an amniocentesis. The amniotic fluid is obtained by sticking a needle into the belly (abdomen). This is done to check the chromosomes in instances where there is a concern about possible genetic problems with the baby. It is also sometimes done near the end of pregnancy if an early delivery is required. In this case, it is done to help make sure the baby's lungs are mature enough for the baby to live outside of the womb. CHANGES OCCURING IN THE SECOND TRIMESTER OF PREGNANCY Your body goes through many changes during pregnancy. They vary from person to person. Talk to your caregiver about changes you notice that you are concerned about.  During the second trimester, you will likely have an increase in your appetite. It is normal to have cravings for certain foods. This varies from person to person and pregnancy to pregnancy.  Your lower abdomen will begin to bulge.  You may have to urinate more often because the uterus and baby are pressing on your bladder. It is also common to get more bladder infections during pregnancy. You can help this by drinking lots of fluids   and emptying your bladder before and after intercourse.  You may begin to get stretch marks on your hips, abdomen, and breasts. These are normal changes in the body during pregnancy. There are no exercises or medicines to take that prevent this change.  You may begin to develop swollen and bulging veins (varicose veins) in your legs. Wearing support hose, elevating your feet for 15 minutes, 3 to 4 times a day and limiting salt in your diet helps lessen the problem.  Heartburn may develop as the uterus grows and  pushes up against the stomach. Antacids recommended by your caregiver helps with this problem. Also, eating smaller meals 4 to 5 times a day helps.  Constipation can be treated with a stool softener or adding bulk to your diet. Drinking lots of fluids, and eating vegetables, fruits, and whole grains are helpful.  Exercising is also helpful. If you have been very active up until your pregnancy, most of these activities can be continued during your pregnancy. If you have been less active, it is helpful to start an exercise program such as walking.  Hemorrhoids may develop at the end of the second trimester. Warm sitz baths and hemorrhoid cream recommended by your caregiver helps hemorrhoid problems.  Backaches may develop during this time of your pregnancy. Avoid heavy lifting, wear low heal shoes, and practice good posture to help with backache problems.  Some pregnant women develop tingling and numbness of their hand and fingers because of swelling and tightening of ligaments in the wrist (carpel tunnel syndrome). This goes away after the baby is born.  As your breasts enlarge, you may have to get a bigger bra. Get a comfortable, cotton, support bra. Do not get a nursing bra until the last month of the pregnancy if you will be nursing the baby.  You may get a dark line from your belly button to the pubic area called the linea nigra.  You may develop rosy cheeks because of increase blood flow to the face.  You may develop spider looking lines of the face, neck, arms, and chest. These go away after the baby is born. HOME CARE INSTRUCTIONS   It is extremely important to avoid all smoking, herbs, alcohol, and unprescribed drugs during your pregnancy. These chemicals affect the formation and growth of the baby. Avoid these chemicals throughout the pregnancy to ensure the delivery of a healthy infant.  Most of your home care instructions are the same as suggested for the first trimester of your  pregnancy. Keep your caregiver's appointments. Follow your caregiver's instructions regarding medicine use, exercise, and diet.  During pregnancy, you are providing food for you and your baby. Continue to eat regular, well-balanced meals. Choose foods such as meat, fish, milk and other low fat dairy products, vegetables, fruits, and whole-grain breads and cereals. Your caregiver will tell you of the ideal weight gain.  A physical sexual relationship may be continued up until near the end of pregnancy if there are no other problems. Problems could include early (premature) leaking of amniotic fluid from the membranes, vaginal bleeding, abdominal pain, or other medical or pregnancy problems.  Exercise regularly if there are no restrictions. Check with your caregiver if you are unsure of the safety of some of your exercises. The greatest weight gain will occur in the last 2 trimesters of pregnancy. Exercise will help you:  Control your weight.  Get you in shape for labor and delivery.  Lose weight after you have the baby.  Wear   a good support or jogging bra for breast tenderness during pregnancy. This may help if worn during sleep. Pads or tissues may be used in the bra if you are leaking colostrum.  Do not use hot tubs, steam rooms or saunas throughout the pregnancy.  Wear your seat belt at all times when driving. This protects you and your baby if you are in an accident.  Avoid raw meat, uncooked cheese, cat litter boxes, and soil used by cats. These carry germs that can cause birth defects in the baby.  The second trimester is also a good time to visit your dentist for your dental health if this has not been done yet. Getting your teeth cleaned is okay. Use a soft toothbrush. Brush gently during pregnancy.  It is easier to leak urine during pregnancy. Tightening up and strengthening the pelvic muscles will help with this problem. Practice stopping your urination while you are going to the  bathroom. These are the same muscles you need to strengthen. It is also the muscles you would use as if you were trying to stop from passing gas. You can practice tightening these muscles up 10 times a set and repeating this about 3 times per day. Once you know what muscles to tighten up, do not perform these exercises during urination. It is more likely to contribute to an infection by backing up the urine.  Ask for help if you have financial, counseling, or nutritional needs during pregnancy. Your caregiver will be able to offer counseling for these needs as well as refer you for other special needs.  Your skin may become oily. If so, wash your face with mild soap, use non-greasy moisturizer and oil or cream based makeup. MEDICINES AND DRUG USE IN PREGNANCY  Take prenatal vitamins as directed. The vitamin should contain 1 milligram of folic acid. Keep all vitamins out of reach of children. Only a couple vitamins or tablets containing iron may be fatal to a baby or young child when ingested.  Avoid use of all medicines, including herbs, over-the-counter medicines, not prescribed or suggested by your caregiver. Only take over-the-counter or prescription medicines for pain, discomfort, or fever as directed by your caregiver. Do not use aspirin.  Let your caregiver also know about herbs you may be using.  Alcohol is related to a number of birth defects. This includes fetal alcohol syndrome. All alcohol, in any form, should be avoided completely. Smoking will cause low birth rate and premature babies.  Street or illegal drugs are very harmful to the baby. They are absolutely forbidden. A baby born to an addicted mother will be addicted at birth. The baby will go through the same withdrawal an adult does. SEEK MEDICAL CARE IF:  You have any concerns or worries during your pregnancy. It is better to call with your questions if you feel they cannot wait, rather than worry about them. SEEK IMMEDIATE  MEDICAL CARE IF:   An unexplained oral temperature above 102 F (38.9 C) develops, or as your caregiver suggests.  You have leaking of fluid from the vagina (birth canal). If leaking membranes are suspected, take your temperature and tell your caregiver of this when you call.  There is vaginal spotting, bleeding, or passing clots. Tell your caregiver of the amount and how many pads are used. Light spotting in pregnancy is common, especially following intercourse.  You develop a bad smelling vaginal discharge with a change in the color from clear to white.  You continue to feel   sick to your stomach (nauseated) and have no relief from remedies suggested. You vomit blood or coffee ground-like materials.  You lose more than 2 pounds of weight or gain more than 2 pounds of weight over 1 week, or as suggested by your caregiver.  You notice swelling of your face, hands, feet, or legs.  You get exposed to German measles and have never had them.  You are exposed to fifth disease or chickenpox.  You develop belly (abdominal) pain. Round ligament discomfort is a common non-cancerous (benign) cause of abdominal pain in pregnancy. Your caregiver still must evaluate you.  You develop a bad headache that does not go away.  You develop fever, diarrhea, pain with urination, or shortness of breath.  You develop visual problems, blurry, or double vision.  You fall or are in a car accident or any kind of trauma.  There is mental or physical violence at home. Document Released: 11/30/2001 Document Revised: 08/30/2012 Document Reviewed: 06/04/2009 [employer] Patient Information 2014 [employer].  Breastfeeding A change in hormones during your pregnancy causes growth of your breast tissue and an increase in number and size of milk ducts. The hormone prolactin allows proteins, sugars, and fats from your blood supply to make breast milk in your milk-producing glands. The hormone progesterone prevents  breast milk from being released before the birth of your baby. After the birth of your baby, your progesterone level decreases allowing breast milk to be released. Thoughts of your baby, as well as his or her sucking or crying, can stimulate the release of milk from the milk-producing glands. Deciding to breastfeed (nurse) is one of the best choices you can make for you and your baby. The information that follows gives a brief review of the benefits, as well as other important skills to know about breastfeeding. BENEFITS OF BREASTFEEDING For your baby  The first milk (colostrum) helps your baby's digestive system function better.   There are antibodies in your milk that help your baby fight off infections.   Your baby has a lower incidence of asthma, allergies, and sudden infant death syndrome (SIDS).   The nutrients in breast milk are better for your baby than infant formulas.  Breast milk improves your baby's brain development.   Your baby will have less gas, colic, and constipation.  Your baby is less likely to develop other conditions, such as childhood obesity, asthma, or diabetes mellitus. For you  Breastfeeding helps develop a very special bond between you and your baby.   Breastfeeding is convenient, always available at the correct temperature, and costs nothing.   Breastfeeding helps to burn calories and helps you lose the weight gained during pregnancy.   Breastfeeding makes your uterus contract back down to normal size faster and slows bleeding following delivery.   Breastfeeding mothers have a lower risk of developing osteoporosis or breast or ovarian cancer later in life.  BREASTFEEDING FREQUENCY  A healthy, full-term baby may breastfeed as often as every hour or space his or her feedings to every 3 hours. Breastfeeding frequency will vary from baby to baby.   Newborns should be fed no less than every 2 3 hours during the day and every 4 5 hours during the  night. You should breastfeed a minimum of 8 feedings in a 24 hour period.  Awaken your baby to breastfeed if it has been 3 4 hours since the last feeding.  Breastfeed when you feel the need to reduce the fullness of your breasts or when   your newborn shows signs of hunger. Signs that your baby may be hungry include:  Increased alertness or activity.  Stretching.  Movement of the head from side to side.  Movement of the head and opening of the mouth when the corner of the mouth or cheek is stroked (rooting).  Increased sucking sounds, smacking lips, cooing, sighing, or squeaking.  Hand-to-mouth movements.  Increased sucking of fingers or hands.  Fussing.  Intermittent crying.  Signs of extreme hunger will require calming and consoling before you try to feed your baby. Signs of extreme hunger may include:  Restlessness.  A loud, strong cry.  Screaming.  Frequent feeding will help you make more milk and will help prevent problems, such as sore nipples and engorgement of the breasts.  BREASTFEEDING   Whether lying down or sitting, be sure that the baby's abdomen is facing your abdomen.   Support your breast with 4 fingers under your breast and your thumb above your nipple. Make sure your fingers are well away from your nipple and your baby's mouth.   Stroke your baby's lips gently with your finger or nipple.   When your baby's mouth is open wide enough, place all of your nipple and as much of the colored area around your nipple (areola) as possible into your baby's mouth.  More areola should be visible above his or her upper lip than below his or her lower lip.  Your baby's tongue should be between his or her lower gum and your breast.  Ensure that your baby's mouth is correctly positioned around the nipple (latched). Your baby's lips should create a seal on your breast.  Signs that your baby has effectively latched onto your nipple include:  Tugging or sucking  without pain.  Swallowing heard between sucks.  Absent click or smacking sound.  Muscle movement above and in front of his or her ears with sucking.  Your baby must suck about 2 3 minutes in order to get your milk. Allow your baby to feed on each breast as long as he or she wants. Nurse your baby until he or she unlatches or falls asleep at the first breast, then offer the second breast.  Signs that your baby is full and satisfied include:  A gradual decrease in the number of sucks or complete cessation of sucking.  Falling asleep.  Extension or relaxation of his or her body.  Retention of a small amount of milk in his or her mouth.  Letting go of your breast by himself or herself.  Signs of effective breastfeeding in you include:  Breasts that have increased firmness, weight, and size prior to feeding.  Breasts that are softer after nursing.  Increased milk volume, as well as a change in milk consistency and color by the 5th day of breastfeeding.  Breast fullness relieved by breastfeeding.  Nipples are not sore, cracked, or bleeding.  If needed, break the suction by putting your finger into the corner of your baby's mouth and sliding your finger between his or her gums. Then, remove your breast from his or her mouth.  It is common for babies to spit up a small amount after a feeding.  Babies often swallow air during feeding. This can make babies fussy. Burping your baby between breasts can help with this.  Vitamin D supplements are recommended for babies who get only breast milk.  Avoid using a pacifier during your baby's first 4 6 weeks.  Avoid supplemental feedings of water, formula, or   juice in place of breastfeeding. Breast milk is all the food your baby needs. It is not necessary for your baby to have water or formula. Your breasts will make more milk if supplemental feedings are avoided during the early weeks. HOW TO TELL WHETHER YOUR BABY IS GETTING ENOUGH BREAST  MILK Wondering whether or not your baby is getting enough milk is a common concern among mothers. You can be assured that your baby is getting enough milk if:   Your baby is actively sucking and you hear swallowing.   Your baby seems relaxed and satisfied after a feeding.   Your baby nurses at least 8 12 times in a 24 hour time period.  During the first 3 5 days of age:  Your baby is wetting at least 3 5 diapers in a 24 hour period. The urine should be clear and pale yellow.  Your baby is having at least 3 4 stools in a 24 hour period. The stool should be soft and yellow.  At 5 7 days of age, your baby is having at least 3 6 stools in a 24 hour period. The stool should be seedy and yellow by 5 days of age.  Your baby has a weight loss less than 7 10% during the first 3 days of age.  Your baby does not lose weight after 3 7 days of age.  Your baby gains 4 7 ounces each week after he or she is 4 days of age.  Your baby gains weight by 5 days of age and is back to birth weight within 2 weeks. ENGORGEMENT In the first week after your baby is born, you may experience extremely full breasts (engorgement). When engorged, your breasts may feel heavy, warm, or tender to the touch. Engorgement peaks within 24 48 hours after delivery of your baby.  Engorgement may be reduced by:  Continuing to breastfeed.  Increasing the frequency of breastfeeding.  Taking warm showers or applying warm, moist heat to your breasts just before each feeding. This increases circulation and helps the milk flow.   Gently massaging your breast before and during the feedings. With your fingertips, massage from your chest wall towards your nipple in a circular motion.   Ensuring that your baby empties at least one breast at every feeding. It also helps to start the next feeding on the opposite breast.   Expressing breast milk by hand or by using a breast pump to empty the breasts if your baby is sleepy, or  not nursing well. You may also want to express milk if you are returning to work oryou feel you are getting engorged.  Ensuring your baby is latched on and positioned properly while breastfeeding. If you follow these suggestions, your engorgement should improve in 24 48 hours. If you are still experiencing difficulty, call your lactation consultant or caregiver.  CARING FOR YOURSELF Take care of your breasts.  Bathe or shower daily.   Avoid using soap on your nipples.   Wear a supportive bra. Avoid wearing underwire style bras.  Air dry your nipples for a 3 4minutes after each feeding.   Use only cotton bra pads to absorb breast milk leakage. Leaking of breast milk between feedings is normal.   Use only pure lanolin on your nipples after nursing. You do not need to wash it off before feeding your baby again. Another option is to express a few drops of breast milk and gently massage that milk into your nipples.  Continue   breast self-awareness checks. Take care of yourself.  Eat healthy foods. Alternate 3 meals with 3 snacks.  Avoid foods that you notice affect your baby in a bad way.  Drink milk, fruit juice, and water to satisfy your thirst (about 8 glasses a day).   Rest often, relax, and take your prenatal vitamins to prevent fatigue, stress, and anemia.  Avoid chewing and smoking tobacco.  Avoid alcohol and drug use.  Take over-the-counter and prescribed medicine only as directed by your caregiver or pharmacist. You should always check with your caregiver or pharmacist before taking any new medicine, vitamin, or herbal supplement.  Know that pregnancy is possible while breastfeeding. If desired, talk to your caregiver about family planning and safe birth control methods that may be used while breastfeeding. SEEK MEDICAL CARE IF:   You feel like you want to stop breastfeeding or have become frustrated with breastfeeding.  You have painful breasts or nipples.  Your  nipples are cracked or bleeding.  Your breasts are red, tender, or warm.  You have a swollen area on either breast.  You have a fever or chills.  You have nausea or vomiting.  You have drainage from your nipples.  Your breasts do not become full before feedings by the 5th day after delivery.  You feel sad and depressed.  Your baby is too sleepy to eat well.  Your baby is having trouble sleeping.   Your baby is wetting less than 3 diapers in a 24 hour period.  Your baby has less than 3 stools in a 24 hour period.  Your baby's skin or the white part of his or her eyes becomes more yellow.   Your baby is not gaining weight by 5 days of age. MAKE SURE YOU:   Understand these instructions.  Will watch your condition.  Will get help right away if you are not doing well or get worse. Document Released: 12/06/2005 Document Revised: 08/30/2012 Document Reviewed: 07/12/2012 [employer] Patient Information 2014 [employer].  

## 2013-06-18 NOTE — Progress Notes (Signed)
[id number] 

## 2013-06-29 ENCOUNTER — Other Ambulatory Visit: Payer: Self-pay | Admitting: Family Medicine

## 2013-06-29 DIAGNOSIS — Z0489 Encounter for examination and observation for other specified reasons: Secondary | ICD-10-CM

## 2013-07-02 NOTE — Addendum Note (Signed)
Addended by: Jill Side on: 07/02/2013 02:23 PM   Modules accepted: Orders

## 2013-07-03 ENCOUNTER — Ambulatory Visit (HOSPITAL_COMMUNITY)
Admission: RE | Admit: 2013-07-03 | Discharge: 2013-07-03 | Disposition: A | Discharge status: Home / Self Care | Payer: Medicaid Other | Source: Ambulatory Visit | Attending: Family Medicine | Admitting: Family Medicine

## 2013-07-03 ENCOUNTER — Encounter: Payer: Self-pay | Admitting: Family Medicine

## 2013-07-03 DIAGNOSIS — Z3689 Encounter for other specified antenatal screening: Secondary | ICD-10-CM | POA: Insufficient documentation

## 2013-07-03 DIAGNOSIS — Z3482 Encounter for supervision of other normal pregnancy, second trimester: Secondary | ICD-10-CM

## 2013-07-20 ENCOUNTER — Encounter: Payer: Commercial Indemnity | Admitting: Family Medicine

## 2013-07-23 ENCOUNTER — Ambulatory Visit (INDEPENDENT_AMBULATORY_CARE_PROVIDER_SITE_OTHER): Payer: Commercial Indemnity | Admitting: Obstetrics & Gynecology

## 2013-07-23 ENCOUNTER — Encounter: Payer: Self-pay | Admitting: Obstetrics & Gynecology

## 2013-07-23 VITALS — BP 117/82 | Wt 173.0 lb

## 2013-07-23 DIAGNOSIS — Z3480 Encounter for supervision of other normal pregnancy, unspecified trimester: Secondary | ICD-10-CM

## 2013-07-23 DIAGNOSIS — Z23 Encounter for immunization: Secondary | ICD-10-CM

## 2013-07-23 LAB — CBC
MCHC: 32.8 g/dL (ref 30.0–36.0)
MCV: 91.6 fL (ref 78.0–100.0)
Platelets: 274 10*3/uL (ref 150–400)
RDW: 12.2 % (ref 11.5–15.5)
WBC: 12 10*3/uL — ABNORMAL HIGH (ref 4.0–10.5)

## 2013-07-23 NOTE — Progress Notes (Signed)
Routine visit. Good FM. Normal aches and pains of pregnancy. She denies VB, ROM, or CTXs. She will get labs, glucola, TDAP today. She will sign [facility] PPS forms today as she wants a pps.

## 2013-07-23 NOTE — Progress Notes (Signed)
P-[id number] 

## 2013-07-24 ENCOUNTER — Encounter: Payer: Commercial Indemnity | Admitting: Family Medicine

## 2013-07-24 LAB — GLUCOSE TOLERANCE, 1 HOUR (50G) W/O FASTING: Glucose, 1 Hour GTT: 113 mg/dL (ref 70–140)

## 2013-07-31 ENCOUNTER — Encounter: Payer: Commercial Indemnity | Admitting: Obstetrics & Gynecology

## 2013-08-07 ENCOUNTER — Encounter: Payer: Self-pay | Admitting: Obstetrics & Gynecology

## 2013-08-07 ENCOUNTER — Ambulatory Visit (INDEPENDENT_AMBULATORY_CARE_PROVIDER_SITE_OTHER): Payer: Commercial Indemnity | Admitting: Obstetrics & Gynecology

## 2013-08-07 VITALS — BP 102/75 | Wt 171.0 lb

## 2013-08-07 DIAGNOSIS — Z3483 Encounter for supervision of other normal pregnancy, third trimester: Secondary | ICD-10-CM

## 2013-08-07 DIAGNOSIS — Z3480 Encounter for supervision of other normal pregnancy, unspecified trimester: Secondary | ICD-10-CM

## 2013-08-07 NOTE — Progress Notes (Signed)
Normal third trimester labs.  BTL papers signed 07/23/13.  No other complaints or concerns.  Fetal movement and labor precautions reviewed.

## 2013-08-07 NOTE — Patient Instructions (Signed)
Return to clinic for any obstetric concerns or go to [facility] for evaluation  

## 2013-08-21 ENCOUNTER — Ambulatory Visit (INDEPENDENT_AMBULATORY_CARE_PROVIDER_SITE_OTHER): Payer: No Typology Code available for payment source | Admitting: Family Medicine

## 2013-08-21 ENCOUNTER — Encounter: Payer: Commercial Indemnity | Admitting: Family Medicine

## 2013-08-21 ENCOUNTER — Encounter: Payer: Self-pay | Admitting: Family Medicine

## 2013-08-21 VITALS — BP 107/71 | Wt 173.0 lb

## 2013-08-21 DIAGNOSIS — O358XX1 Maternal care for other (suspected) fetal abnormality and damage, fetus 1: Secondary | ICD-10-CM

## 2013-08-21 DIAGNOSIS — O358XX0 Maternal care for other (suspected) fetal abnormality and damage, not applicable or unspecified: Secondary | ICD-10-CM | POA: Insufficient documentation

## 2013-08-21 DIAGNOSIS — Z3480 Encounter for supervision of other normal pregnancy, unspecified trimester: Secondary | ICD-10-CM

## 2013-08-21 NOTE — Progress Notes (Signed)
[id number] 

## 2013-08-21 NOTE — Patient Instructions (Signed)
Pregnancy - Third Trimester The third trimester of pregnancy (the last 3 months) is a period of the most rapid growth for you and your baby. The baby approaches a length of 20 inches and a weight of 6 to 10 pounds. The baby is adding on fat and getting ready for life outside your body. While inside, babies have periods of sleeping and waking, sucking thumbs, and hiccuping. You can often feel small contractions of the uterus. This is false labor. It is also called Braxton-Hicks contractions. This is like a practice for labor. The usual problems in this stage of pregnancy include more difficulty breathing, swelling of the hands and feet from water retention, and having to urinate more often because of the uterus and baby pressing on your bladder.  PRENATAL EXAMS  Blood work may continue to be done during prenatal exams. These tests are done to check on your health and the probable health of your baby. Blood work is used to follow your blood levels (hemoglobin). Anemia (low hemoglobin) is common during pregnancy. Iron and vitamins are given to help prevent this. You may also continue to be checked for diabetes. Some of the past blood tests may be done again.  The size of the uterus is measured during each visit. This makes sure your baby is growing properly according to your pregnancy dates.  Your blood pressure is checked every prenatal visit. This is to make sure you are not getting toxemia.  Your urine is checked every prenatal visit for infection, diabetes, and protein.  Your weight is checked at each visit. This is done to make sure gains are happening at the suggested rate and that you and your baby are growing normally.  Sometimes, an ultrasound is performed to confirm the position and the proper growth and development of the baby. This is a test done that bounces harmless sound waves off the baby so your caregiver can more accurately determine a due date.  Discuss the type of pain medicine and  anesthesia you will have during your labor and delivery.  Discuss the possibility and anesthesia if a cesarean section might be necessary.  Inform your caregiver if there is any mental or physical violence at home. Sometimes, a specialized non-stress test, contraction stress test, and biophysical profile are done to make sure the baby is not having a problem. Checking the amniotic fluid surrounding the baby is called an amniocentesis. The amniotic fluid is removed by sticking a needle into the belly (abdomen). This is sometimes done near the end of pregnancy if an early delivery is required. In this case, it is done to help make sure the baby's lungs are mature enough for the baby to live outside of the womb. If the lungs are not mature and it is unsafe to deliver the baby, an injection of cortisone medicine is given to the mother 1 to 2 days before the delivery. This helps the baby's lungs mature and makes it safer to deliver the baby. CHANGES OCCURING IN THE THIRD TRIMESTER OF PREGNANCY Your body goes through many changes during pregnancy. They vary from person to person. Talk to your caregiver about changes you notice and are concerned about.  During the last trimester, you have probably had an increase in your appetite. It is normal to have cravings for certain foods. This varies from person to person and pregnancy to pregnancy.  You may begin to get stretch marks on your hips, abdomen, and breasts. These are normal changes in the body   during pregnancy. There are no exercises or medicines to take which prevent this change.  Constipation may be treated with a stool softener or adding bulk to your diet. Drinking lots of fluids, fiber in vegetables, fruits, and whole grains are helpful.  Exercising is also helpful. If you have been very active up until your pregnancy, most of these activities can be continued during your pregnancy. If you have been less active, it is helpful to start an exercise  program such as walking. Consult your caregiver before starting exercise programs.  Avoid all smoking, alcohol, non-prescribed drugs, herbs and "street drugs" during your pregnancy. These chemicals affect the formation and growth of the baby. Avoid chemicals throughout the pregnancy to ensure the delivery of a healthy infant.  Backache, varicose veins, and hemorrhoids may develop or get worse.  You will tire more easily in the third trimester, which is normal.  The baby's movements may be stronger and more often.  You may become short of breath easily.  Your belly button may stick out.  A yellow discharge may leak from your breasts called colostrum.  You may have a bloody mucus discharge. This usually occurs a few days to a week before labor begins. HOME CARE INSTRUCTIONS   Keep your caregiver's appointments. Follow your caregiver's instructions regarding medicine use, exercise, and diet.  During pregnancy, you are providing food for you and your baby. Continue to eat regular, well-balanced meals. Choose foods such as meat, fish, milk and other low fat dairy products, vegetables, fruits, and whole-grain breads and cereals. Your caregiver will tell you of the ideal weight gain.  A physical sexual relationship may be continued throughout pregnancy if there are no other problems such as early (premature) leaking of amniotic fluid from the membranes, vaginal bleeding, or belly (abdominal) pain.  Exercise regularly if there are no restrictions. Check with your caregiver if you are unsure of the safety of your exercises. Greater weight gain will occur in the last 2 trimesters of pregnancy. Exercising helps:  Control your weight.  Get you in shape for labor and delivery.  You lose weight after you deliver.  Rest a lot with legs elevated, or as needed for leg cramps or low back pain.  Wear a good support or jogging bra for breast tenderness during pregnancy. This may help if worn during  sleep. Pads or tissues may be used in the bra if you are leaking colostrum.  Do not use hot tubs, steam rooms, or saunas.  Wear your seat belt when driving. This protects you and your baby if you are in an accident.  Avoid raw meat, cat litter boxes and soil used by cats. These carry germs that can cause birth defects in the baby.  It is easier to leak urine during pregnancy. Tightening up and strengthening the pelvic muscles will help with this problem. You can practice stopping your urination while you are going to the bathroom. These are the same muscles you need to strengthen. It is also the muscles you would use if you were trying to stop from passing gas. You can practice tightening these muscles up 10 times a set and repeating this about 3 times per day. Once you know what muscles to tighten up, do not perform these exercises during urination. It is more likely to cause an infection by backing up the urine.  Ask for help if you have financial, counseling, or nutritional needs during pregnancy. Your caregiver will be able to offer counseling for these   needs as well as refer you for other special needs.  Make a list of emergency phone numbers and have them available.  Plan on getting help from family or friends when you go home from the hospital.  Make a trial run to the hospital.  Take prenatal classes with the father to understand, practice, and ask questions about the labor and delivery.  Prepare the baby's room or nursery.  Do not travel out of the city unless it is absolutely necessary and with the advice of your caregiver.  Wear only low or no heal shoes to have better balance and prevent falling. MEDICINES AND DRUG USE IN PREGNANCY  Take prenatal vitamins as directed. The vitamin should contain 1 milligram of folic acid. Keep all vitamins out of reach of children. Only a couple vitamins or tablets containing iron may be fatal to a baby or young child when ingested.  Avoid use  of all medicines, including herbs, over-the-counter medicines, not prescribed or suggested by your caregiver. Only take over-the-counter or prescription medicines for pain, discomfort, or fever as directed by your caregiver. Do not use aspirin, ibuprofen or naproxen unless approved by your caregiver.  Let your caregiver also know about herbs you may be using.  Alcohol is related to a number of birth defects. This includes fetal alcohol syndrome. All alcohol, in any form, should be avoided completely. Smoking will cause low birth rate and premature babies.  Illegal drugs are very harmful to the baby. They are absolutely forbidden. A baby born to an addicted mother will be addicted at birth. The baby will go through the same withdrawal an adult does. SEEK MEDICAL CARE IF: You have any concerns or worries during your pregnancy. It is better to call with your questions if you feel they cannot wait, rather than worry about them. SEEK IMMEDIATE MEDICAL CARE IF:   An unexplained oral temperature above 102 F (38.9 C) develops, or as your caregiver suggests.  You have leaking of fluid from the vagina. If leaking membranes are suspected, take your temperature and tell your caregiver of this when you call.  There is vaginal spotting, bleeding or passing clots. Tell your caregiver of the amount and how many pads are used.  You develop a bad smelling vaginal discharge with a change in the color from clear to white.  You develop vomiting that lasts more than 24 hours.  You develop chills or fever.  You develop shortness of breath.  You develop burning on urination.  You loose more than 2 pounds of weight or gain more than 2 pounds of weight or as suggested by your caregiver.  You notice sudden swelling of your face, hands, and feet or legs.  You develop belly (abdominal) pain. Round ligament discomfort is a common non-cancerous (benign) cause of abdominal pain in pregnancy. Your caregiver still  must evaluate you.  You develop a severe headache that does not go away.  You develop visual problems, blurred or double vision.  If you have not felt your baby move for more than 1 hour. If you think the baby is not moving as much as usual, eat something with sugar in it and lie down on your left side for an hour. The baby should move at least 4 to 5 times per hour. Call right away if your baby moves less than that.  You fall, are in a car accident, or any kind of trauma.  There is mental or physical violence at home. Document Released: 11/30/2001   Document Revised: 08/30/2012 Document Reviewed: 06/04/2009 [employer] Patient Information 2014 [employer].  Breastfeeding A change in hormones during your pregnancy causes growth of your breast tissue and an increase in number and size of milk ducts. The hormone prolactin allows proteins, sugars, and fats from your blood supply to make breast milk in your milk-producing glands. The hormone progesterone prevents breast milk from being released before the birth of your baby. After the birth of your baby, your progesterone level decreases allowing breast milk to be released. Thoughts of your baby, as well as his or her sucking or crying, can stimulate the release of milk from the milk-producing glands. Deciding to breastfeed (nurse) is one of the best choices you can make for you and your baby. The information that follows gives a brief review of the benefits, as well as other important skills to know about breastfeeding. BENEFITS OF BREASTFEEDING For your baby  The first milk (colostrum) helps your baby's digestive system function better.   There are antibodies in your milk that help your baby fight off infections.   Your baby has a lower incidence of asthma, allergies, and sudden infant death syndrome (SIDS).   The nutrients in breast milk are better for your baby than infant formulas.  Breast milk improves your baby's brain development.    Your baby will have less gas, colic, and constipation.  Your baby is less likely to develop other conditions, such as childhood obesity, asthma, or diabetes mellitus. For you  Breastfeeding helps develop a very special bond between you and your baby.   Breastfeeding is convenient, always available at the correct temperature, and costs nothing.   Breastfeeding helps to burn calories and helps you lose the weight gained during pregnancy.   Breastfeeding makes your uterus contract back down to normal size faster and slows bleeding following delivery.   Breastfeeding mothers have a lower risk of developing osteoporosis or breast or ovarian cancer later in life.  BREASTFEEDING FREQUENCY  A healthy, full-term baby may breastfeed as often as every hour or space his or her feedings to every 3 hours. Breastfeeding frequency will vary from baby to baby.   Newborns should be fed no less than every 2 3 hours during the day and every 4 5 hours during the night. You should breastfeed a minimum of 8 feedings in a 24 hour period.  Awaken your baby to breastfeed if it has been 3 4 hours since the last feeding.  Breastfeed when you feel the need to reduce the fullness of your breasts or when your newborn shows signs of hunger. Signs that your baby may be hungry include:  Increased alertness or activity.  Stretching.  Movement of the head from side to side.  Movement of the head and opening of the mouth when the corner of the mouth or cheek is stroked (rooting).  Increased sucking sounds, smacking lips, cooing, sighing, or squeaking.  Hand-to-mouth movements.  Increased sucking of fingers or hands.  Fussing.  Intermittent crying.  Signs of extreme hunger will require calming and consoling before you try to feed your baby. Signs of extreme hunger may include:  Restlessness.  A loud, strong cry.  Screaming.  Frequent feeding will help you make more milk and will help prevent  problems, such as sore nipples and engorgement of the breasts.  BREASTFEEDING   Whether lying down or sitting, be sure that the baby's abdomen is facing your abdomen.   Support your breast with 4 fingers under your breast   and your thumb above your nipple. Make sure your fingers are well away from your nipple and your baby's mouth.   Stroke your baby's lips gently with your finger or nipple.   When your baby's mouth is open wide enough, place all of your nipple and as much of the colored area around your nipple (areola) as possible into your baby's mouth.  More areola should be visible above his or her upper lip than below his or her lower lip.  Your baby's tongue should be between his or her lower gum and your breast.  Ensure that your baby's mouth is correctly positioned around the nipple (latched). Your baby's lips should create a seal on your breast.  Signs that your baby has effectively latched onto your nipple include:  Tugging or sucking without pain.  Swallowing heard between sucks.  Absent click or smacking sound.  Muscle movement above and in front of his or her ears with sucking.  Your baby must suck about 2 3 minutes in order to get your milk. Allow your baby to feed on each breast as long as he or she wants. Nurse your baby until he or she unlatches or falls asleep at the first breast, then offer the second breast.  Signs that your baby is full and satisfied include:  A gradual decrease in the number of sucks or complete cessation of sucking.  Falling asleep.  Extension or relaxation of his or her body.  Retention of a small amount of milk in his or her mouth.  Letting go of your breast by himself or herself.  Signs of effective breastfeeding in you include:  Breasts that have increased firmness, weight, and size prior to feeding.  Breasts that are softer after nursing.  Increased milk volume, as well as a change in milk consistency and color by the 5th  day of breastfeeding.  Breast fullness relieved by breastfeeding.  Nipples are not sore, cracked, or bleeding.  If needed, break the suction by putting your finger into the corner of your baby's mouth and sliding your finger between his or her gums. Then, remove your breast from his or her mouth.  It is common for babies to spit up a small amount after a feeding.  Babies often swallow air during feeding. This can make babies fussy. Burping your baby between breasts can help with this.  Vitamin D supplements are recommended for babies who get only breast milk.  Avoid using a pacifier during your baby's first 4 6 weeks.  Avoid supplemental feedings of water, formula, or juice in place of breastfeeding. Breast milk is all the food your baby needs. It is not necessary for your baby to have water or formula. Your breasts will make more milk if supplemental feedings are avoided during the early weeks. HOW TO TELL WHETHER YOUR BABY IS GETTING ENOUGH BREAST MILK Wondering whether or not your baby is getting enough milk is a common concern among mothers. You can be assured that your baby is getting enough milk if:   Your baby is actively sucking and you hear swallowing.   Your baby seems relaxed and satisfied after a feeding.   Your baby nurses at least 8 12 times in a 24 hour time period.  During the first 3 5 days of age:  Your baby is wetting at least 3 5 diapers in a 24 hour period. The urine should be clear and pale yellow.  Your baby is having at least 3 4 stools in   a 24 hour period. The stool should be soft and yellow.  At 5 7 days of age, your baby is having at least 3 6 stools in a 24 hour period. The stool should be seedy and yellow by 5 days of age.  Your baby has a weight loss less than 7 10% during the first 3 days of age.  Your baby does not lose weight after 3 7 days of age.  Your baby gains 4 7 ounces each week after he or she is 4 days of age.  Your baby gains weight  by 5 days of age and is back to birth weight within 2 weeks. ENGORGEMENT In the first week after your baby is born, you may experience extremely full breasts (engorgement). When engorged, your breasts may feel heavy, warm, or tender to the touch. Engorgement peaks within 24 48 hours after delivery of your baby.  Engorgement may be reduced by:  Continuing to breastfeed.  Increasing the frequency of breastfeeding.  Taking warm showers or applying warm, moist heat to your breasts just before each feeding. This increases circulation and helps the milk flow.   Gently massaging your breast before and during the feedings. With your fingertips, massage from your chest wall towards your nipple in a circular motion.   Ensuring that your baby empties at least one breast at every feeding. It also helps to start the next feeding on the opposite breast.   Expressing breast milk by hand or by using a breast pump to empty the breasts if your baby is sleepy, or not nursing well. You may also want to express milk if you are returning to work oryou feel you are getting engorged.  Ensuring your baby is latched on and positioned properly while breastfeeding. If you follow these suggestions, your engorgement should improve in 24 48 hours. If you are still experiencing difficulty, call your lactation consultant or caregiver.  CARING FOR YOURSELF Take care of your breasts.  Bathe or shower daily.   Avoid using soap on your nipples.   Wear a supportive bra. Avoid wearing underwire style bras.  Air dry your nipples for a 3 4minutes after each feeding.   Use only cotton bra pads to absorb breast milk leakage. Leaking of breast milk between feedings is normal.   Use only pure lanolin on your nipples after nursing. You do not need to wash it off before feeding your baby again. Another option is to express a few drops of breast milk and gently massage that milk into your nipples.  Continue breast  self-awareness checks. Take care of yourself.  Eat healthy foods. Alternate 3 meals with 3 snacks.  Avoid foods that you notice affect your baby in a bad way.  Drink milk, fruit juice, and water to satisfy your thirst (about 8 glasses a day).   Rest often, relax, and take your prenatal vitamins to prevent fatigue, stress, and anemia.  Avoid chewing and smoking tobacco.  Avoid alcohol and drug use.  Take over-the-counter and prescribed medicine only as directed by your caregiver or pharmacist. You should always check with your caregiver or pharmacist before taking any new medicine, vitamin, or herbal supplement.  Know that pregnancy is possible while breastfeeding. If desired, talk to your caregiver about family planning and safe birth control methods that may be used while breastfeeding. SEEK MEDICAL CARE IF:   You feel like you want to stop breastfeeding or have become frustrated with breastfeeding.  You have painful breasts or nipples.    Your nipples are cracked or bleeding.  Your breasts are red, tender, or warm.  You have a swollen area on either breast.  You have a fever or chills.  You have nausea or vomiting.  You have drainage from your nipples.  Your breasts do not become full before feedings by the 5th day after delivery.  You feel sad and depressed.  Your baby is too sleepy to eat well.  Your baby is having trouble sleeping.   Your baby is wetting less than 3 diapers in a 24 hour period.  Your baby has less than 3 stools in a 24 hour period.  Your baby's skin or the white part of his or her eyes becomes more yellow.   Your baby is not gaining weight by 5 days of age. MAKE SURE YOU:   Understand these instructions.  Will watch your condition.  Will get help right away if you are not doing well or get worse. Document Released: 12/06/2005 Document Revised: 08/30/2012 Document Reviewed: 07/12/2012 [employer] Patient Information 2014 [employer].  

## 2013-08-21 NOTE — Progress Notes (Signed)
Doing well--for f/u u/s for fetal renal pyelectasis.

## 2013-09-03 ENCOUNTER — Ambulatory Visit (INDEPENDENT_AMBULATORY_CARE_PROVIDER_SITE_OTHER): Payer: No Typology Code available for payment source | Admitting: Obstetrics & Gynecology

## 2013-09-03 VITALS — BP 122/87 | Wt 177.0 lb

## 2013-09-03 DIAGNOSIS — Z3480 Encounter for supervision of other normal pregnancy, unspecified trimester: Secondary | ICD-10-CM

## 2013-09-03 NOTE — Progress Notes (Signed)
Routine visit. Good FM. No problems. Plans to get her flu vaccine at work next week. Has a follow up u/s next week.

## 2013-09-03 NOTE — Patient Instructions (Signed)
Influenza Vaccine (Flu Vaccine, Inactivated) 2013 2014 What You Need to Know WHY GET VACCINATED?  Influenza ("flu") is a contagious disease that spreads around the [place] every winter, usually between October and May.  Flu is caused by the influenza virus, and can be spread by coughing, sneezing, and close contact.  Anyone can get flu, but the risk of getting flu is highest among children. Symptoms come on suddenly and may last several days. They can include:  Fever or chills.  Sore throat.  Muscle aches.  Fatigue.  Cough.  Headache.  Runny or stuffy nose. Flu can make some people much sicker than others. These people include young children, people 65 and older, pregnant women, and people with certain health conditions such as heart, lung or kidney disease, or a weakened immune system. Flu vaccine is especially important for these people, and anyone in close contact with them. Flu can also lead to pneumonia, and make existing medical conditions worse. It can cause diarrhea and seizures in children. Each year thousands of people in the [place] die from flu, and many more are hospitalized. Flu vaccine is the best protection we have from flu and its complications. Flu vaccine also helps prevent spreading flu from person to person. INACTIVATED FLU VACCINE There are 2 types of influenza vaccine:  You are getting an inactivated flu vaccine, which does not contain any live influenza virus. It is given by injection with a needle, and often called the "flu shot."  A different live, attenuated (weakened) influenza vaccine is sprayed into the nostrils. This vaccine is described in a separate Vaccine Information Statement. Flu vaccine is recommended every year. Children 6 months through 8 years of age should get 2 doses the first year they get vaccinated. Flu viruses are always changing. Each year's flu vaccine is made to protect from viruses that are most likely to cause disease  that year. While flu vaccine cannot prevent all cases of flu, it is our best defense against the disease. Inactivated flu vaccine protects against 3 or 4 different influenza viruses. It takes about 2 weeks for protection to develop after the vaccination, and protection lasts several months to a year. Some illnesses that are not caused by influenza virus are often mistaken for flu. Flu vaccine will not prevent these illnesses. It can only prevent influenza. A "high-dose" flu vaccine is available for people 65 years of age and older. The person giving you the vaccine can tell you more about it. Some inactivated flu vaccine contains a very small amount of a mercury-based preservative called thimerosal. Studies have shown that thimerosal in vaccines is not harmful, but flu vaccines that do not contain a preservative are available. SOME PEOPLE SHOULD NOT GET THIS VACCINE Tell the person who gives you the vaccine:  If you have any severe (life-threatening) allergies. If you ever had a life-threatening allergic reaction after a dose of flu vaccine, or have a severe allergy to any part of this vaccine, you may be advised not to get a dose. Most, but not all, types of flu vaccine contain a small amount of egg.  If you ever had Guillain Barr Syndrome (a severe paralyzing illness, also called GBS). Some people with a history of GBS should not get this vaccine. This should be discussed with your doctor.  If you are not feeling well. They might suggest waiting until you feel better. But you should come back. RISKS OF A VACCINE REACTION With a vaccine, like any medicine, there   is a chance of side effects. These are usually mild and go away on their own. Serious side effects are also possible, but are very rare. Inactivated flu vaccine does not contain live flu virus, sogetting flu from this vaccine is not possible. Brief fainting spells and related symptoms (such as jerking movements) can happen after any medical  procedure, including vaccination. Sitting or lying down for about 15 minutes after a vaccination can help prevent fainting and injuries caused by falls. Tell your doctor if you feel dizzy or lightheaded, or have vision changes or ringing in the ears. Mild problems following inactivated flu vaccine:  Soreness, redness, or swelling where the shot was given.  Hoarseness; sore, red or itchy eyes; or cough.  Fever.  Aches.  Headache.  Itching.  Fatigue. If these problems occur, they usually begin soon after the shot and last 1 or 2 days. Moderate problems following inactivated flu vaccine:  Young children who get inactivated flu vaccine and pneumococcal vaccine (PCV13) at the same time may be at increased risk for seizures caused by fever. Ask your doctor for more information. Tell your doctor if a child who is getting flu vaccine has ever had a seizure. Severe problems following inactivated flu vaccine:  A severe allergic reaction could occur after any vaccine (estimated less than 1 in a million doses).  There is a small possibility that inactivated flu vaccine could be associated with Guillan Barr Syndrome (GBS), no more than 1 or 2 cases per million people vaccinated. This is much lower than the risk of severe complications from flu, which can be prevented by flu vaccine. The safety of vaccines is always being monitored. For more information, visit: www.cdc.gov/vaccinesafety/ WHAT IF THERE IS A SERIOUS REACTION? What should I look for?  Look for anything that concerns you, such as signs of a severe allergic reaction, very high fever, or behavior changes. Signs of a severe allergic reaction can include hives, swelling of the face and throat, difficulty breathing, a fast heartbeat, dizziness, and weakness. These would start a few minutes to a few hours after the vaccination. What should I do?  If you think it is a severe allergic reaction or other emergency that cannot wait, call 9 1 1  or get the person to the nearest hospital. Otherwise, call your doctor.  Afterward, the reaction should be reported to the Vaccine Adverse Event Reporting System (VAERS). Your doctor might file this report, or you can do it yourself through the VAERS website at www.vaers.hhs.gov, or by calling 1-800-822-7967. VAERS is only for reporting reactions. They do not give medical advice. THE NATIONAL VACCINE INJURY COMPENSATION PROGRAM The National Vaccine Injury Compensation Program (VICP) is a federal program that was created to compensate people who may have been injured by certain vaccines. Persons who believe they may have been injured by a vaccine can learn about the program and about filing a claim by calling 1-800-338-2382 or visiting the VICP website at www.hrsa.gov/vaccinecompensation HOW CAN I LEARN MORE?  Ask your doctor.  Call your local or state health department.  Contact the Centers for Disease Control and Prevention (CDC):  Call 1-800-232-4636 (1-800-CDC-INFO) or  Visit CDC's website at www.cdc.gov/flu CDC Inactivated Influenza Vaccine Interim VIS (07/14/12) Document Released: 09/30/2006 Document Revised: 08/30/2012 Document Reviewed: 07/14/2012 ExitCare Patient Information 2014 [employer].  

## 2013-09-03 NOTE — Progress Notes (Signed)
P= 80 

## 2013-09-04 ENCOUNTER — Encounter: Payer: No Typology Code available for payment source | Admitting: Obstetrics & Gynecology

## 2013-09-11 ENCOUNTER — Ambulatory Visit (HOSPITAL_COMMUNITY)
Admission: RE | Admit: 2013-09-11 | Discharge: 2013-09-11 | Disposition: A | Discharge status: Home / Self Care | Payer: No Typology Code available for payment source | Source: Ambulatory Visit | Attending: Family Medicine | Admitting: Family Medicine

## 2013-09-11 ENCOUNTER — Other Ambulatory Visit: Payer: Self-pay | Admitting: Family Medicine

## 2013-09-11 DIAGNOSIS — O358XX1 Maternal care for other (suspected) fetal abnormality and damage, fetus 1: Secondary | ICD-10-CM

## 2013-09-11 DIAGNOSIS — Z363 Encounter for antenatal screening for malformations: Secondary | ICD-10-CM | POA: Insufficient documentation

## 2013-09-11 DIAGNOSIS — Z1389 Encounter for screening for other disorder: Secondary | ICD-10-CM | POA: Insufficient documentation

## 2013-09-11 DIAGNOSIS — O358XX0 Maternal care for other (suspected) fetal abnormality and damage, not applicable or unspecified: Secondary | ICD-10-CM | POA: Insufficient documentation

## 2013-09-12 ENCOUNTER — Encounter: Payer: Self-pay | Admitting: Family Medicine

## 2013-09-18 ENCOUNTER — Encounter: Payer: Self-pay | Admitting: Obstetrics & Gynecology

## 2013-09-18 ENCOUNTER — Ambulatory Visit (INDEPENDENT_AMBULATORY_CARE_PROVIDER_SITE_OTHER): Payer: No Typology Code available for payment source | Admitting: Obstetrics & Gynecology

## 2013-09-18 VITALS — BP 121/87 | Wt 179.0 lb

## 2013-09-18 DIAGNOSIS — Z3480 Encounter for supervision of other normal pregnancy, unspecified trimester: Secondary | ICD-10-CM

## 2013-09-18 NOTE — Progress Notes (Signed)
Routine visit. Good FM. Renal pyelectasis has resolved. She got a flu vaccine at work last week. No problems.

## 2013-09-18 NOTE — Progress Notes (Signed)
p=76 

## 2013-09-18 NOTE — Patient Instructions (Signed)

## 2013-09-25 ENCOUNTER — Ambulatory Visit (INDEPENDENT_AMBULATORY_CARE_PROVIDER_SITE_OTHER): Payer: No Typology Code available for payment source | Admitting: Obstetrics & Gynecology

## 2013-09-25 VITALS — BP 119/93 | Wt 181.0 lb

## 2013-09-25 DIAGNOSIS — Z3480 Encounter for supervision of other normal pregnancy, unspecified trimester: Secondary | ICD-10-CM

## 2013-09-25 DIAGNOSIS — Z3483 Encounter for supervision of other normal pregnancy, third trimester: Secondary | ICD-10-CM

## 2013-09-25 NOTE — Progress Notes (Signed)
Routine visit. Good FM. She complains of vaginal pressure and is requesting a vaginal exam. She denies VB or ROM. Cervical cultures obtained today. Labor precautions reviewed.

## 2013-09-25 NOTE — Patient Instructions (Signed)

## 2013-09-26 LAB — GC/CHLAMYDIA PROBE AMP
CT Probe RNA: NEGATIVE
GC Probe RNA: NEGATIVE

## 2013-09-28 LAB — CULTURE, BETA STREP (GROUP B ONLY)

## 2013-10-02 ENCOUNTER — Ambulatory Visit (INDEPENDENT_AMBULATORY_CARE_PROVIDER_SITE_OTHER): Payer: No Typology Code available for payment source | Admitting: Obstetrics & Gynecology

## 2013-10-02 VITALS — BP 125/90 | Wt 184.0 lb

## 2013-10-02 DIAGNOSIS — Z3483 Encounter for supervision of other normal pregnancy, third trimester: Secondary | ICD-10-CM

## 2013-10-02 DIAGNOSIS — Z3480 Encounter for supervision of other normal pregnancy, unspecified trimester: Secondary | ICD-10-CM

## 2013-10-02 NOTE — Progress Notes (Signed)
GBS and other cultures negative.  No cervical change.  Monitor BP closely. If DBP >90 next visit, may need to start antenatal testing for [facility].  No other complaints or concerns.  PreeclampsiaFetal movement and labor precautions reviewed.

## 2013-10-02 NOTE — Patient Instructions (Signed)
Return to clinic for any obstetric concerns or go to [facility] for evaluation  

## 2013-10-08 ENCOUNTER — Inpatient Hospital Stay (HOSPITAL_COMMUNITY)
Admission: AD | Admit: 2013-10-08 | Discharge: 2013-10-10 | DRG: 767 | Disposition: A | Discharge status: Home / Self Care | Payer: No Typology Code available for payment source | Source: Ambulatory Visit | Attending: Obstetrics & Gynecology | Admitting: Obstetrics & Gynecology

## 2013-10-08 ENCOUNTER — Ambulatory Visit (INDEPENDENT_AMBULATORY_CARE_PROVIDER_SITE_OTHER): Payer: No Typology Code available for payment source | Admitting: Obstetrics & Gynecology

## 2013-10-08 ENCOUNTER — Encounter: Payer: Self-pay | Admitting: Obstetrics & Gynecology

## 2013-10-08 ENCOUNTER — Encounter (HOSPITAL_COMMUNITY): Payer: Self-pay | Admitting: *Deleted

## 2013-10-08 VITALS — BP 143/90 | Wt 182.6 lb

## 2013-10-08 DIAGNOSIS — Z141 Cystic fibrosis carrier: Secondary | ICD-10-CM

## 2013-10-08 DIAGNOSIS — Z302 Encounter for sterilization: Secondary | ICD-10-CM

## 2013-10-08 DIAGNOSIS — O139 Gestational [pregnancy-induced] hypertension without significant proteinuria, unspecified trimester: Secondary | ICD-10-CM

## 2013-10-08 DIAGNOSIS — O133 Gestational [pregnancy-induced] hypertension without significant proteinuria, third trimester: Secondary | ICD-10-CM

## 2013-10-08 DIAGNOSIS — Z3480 Encounter for supervision of other normal pregnancy, unspecified trimester: Secondary | ICD-10-CM

## 2013-10-08 DIAGNOSIS — O0993 Supervision of high risk pregnancy, unspecified, third trimester: Secondary | ICD-10-CM

## 2013-10-08 HISTORY — DX: Other specified health status: Z78.9

## 2013-10-08 LAB — COMPREHENSIVE METABOLIC PANEL
ALT: 6 U/L (ref 0–35)
AST: 13 U/L (ref 0–37)
Albumin: 2.6 g/dL — ABNORMAL LOW (ref 3.5–5.2)
Alkaline Phosphatase: 163 U/L — ABNORMAL HIGH (ref 39–117)
CO2: 21 mEq/L (ref 19–32)
Chloride: 104 mEq/L (ref 96–112)
Creatinine, Ser: 0.61 mg/dL (ref 0.50–1.10)
GFR calc non Af Amer: >90 mL/min (ref >90)
Potassium: 4 mEq/L (ref 3.5–5.1)
Sodium: 134 mEq/L — ABNORMAL LOW (ref 135–145)
Total Bilirubin: 0.2 mg/dL — ABNORMAL LOW (ref 0.3–1.2)
Total Protein: 6.1 g/dL (ref 6.0–8.3)

## 2013-10-08 LAB — CBC
Hemoglobin: 13.2 g/dL (ref 12.0–15.0)
MCH: 29 pg (ref 26.0–34.0)
Platelets: 229 10*3/uL (ref 150–400)
RBC: 4.55 MIL/uL (ref 3.87–5.11)
WBC: 13.5 10*3/uL — ABNORMAL HIGH (ref 4.0–10.5)

## 2013-10-08 MED ORDER — ONDANSETRON HCL 4 MG/2ML IJ SOLN: 4.0000 mg | Freq: Four times a day (QID) | INTRAMUSCULAR | Status: DC | PRN

## 2013-10-08 MED ORDER — OXYCODONE-ACETAMINOPHEN 5-325 MG PO TABS: 1.0000 | ORAL_TABLET | ORAL | Status: DC | PRN

## 2013-10-08 MED ORDER — PHENYLEPHRINE 40 MCG/ML (10ML) SYRINGE FOR IV PUSH (FOR BLOOD PRESSURE SUPPORT)
80.0000 ug | PREFILLED_SYRINGE | INTRAVENOUS | Status: DC | PRN
  Filled 2013-10-08: qty 2

## 2013-10-08 MED ORDER — MISOPROSTOL 25 MCG QUARTER TABLET
50.0000 ug | ORAL_TABLET | Freq: Once | ORAL | Status: AC
  Administered 2013-10-08: 50 ug via ORAL
  Filled 2013-10-08: qty 0.5

## 2013-10-08 MED ORDER — FENTANYL 2.5 MCG/ML BUPIVACAINE 1/10 % EPIDURAL INFUSION (WH - ANES)
14.0000 mL/h | INTRAMUSCULAR | Status: DC | PRN
  Filled 2013-10-08: qty 125

## 2013-10-08 MED ORDER — CITRIC ACID-SODIUM CITRATE 334-500 MG/5ML PO SOLN: 30.0000 mL | ORAL | Status: DC | PRN

## 2013-10-08 MED ORDER — DIPHENHYDRAMINE HCL 50 MG/ML IJ SOLN: 12.5000 mg | INTRAMUSCULAR | Status: DC | PRN

## 2013-10-08 MED ORDER — IBUPROFEN 600 MG PO TABS
600.0000 mg | ORAL_TABLET | Freq: Four times a day (QID) | ORAL | Status: DC | PRN
  Administered 2013-10-09: 600 mg via ORAL
  Filled 2013-10-08: qty 1

## 2013-10-08 MED ORDER — EPHEDRINE 5 MG/ML INJ
10.0000 mg | INTRAVENOUS | Status: DC | PRN
  Filled 2013-10-08: qty 2

## 2013-10-08 MED ORDER — EPHEDRINE 5 MG/ML INJ
10.0000 mg | INTRAVENOUS | Status: DC | PRN
  Filled 2013-10-08: qty 4
  Filled 2013-10-08: qty 2

## 2013-10-08 MED ORDER — LACTATED RINGERS IV SOLN
INTRAVENOUS | Status: DC
  Administered 2013-10-08 (×2): via INTRAVENOUS

## 2013-10-08 MED ORDER — FENTANYL CITRATE 0.05 MG/ML IJ SOLN
100.0000 ug | INTRAMUSCULAR | Status: DC | PRN
  Administered 2013-10-08 (×2): 100 ug via INTRAVENOUS
  Filled 2013-10-08 (×2): qty 2

## 2013-10-08 MED ORDER — LIDOCAINE HCL (PF) 1 % IJ SOLN
30.0000 mL | INTRAMUSCULAR | Status: DC | PRN
  Filled 2013-10-08 (×2): qty 30

## 2013-10-08 MED ORDER — OXYTOCIN BOLUS FROM INFUSION
500.0000 mL | INTRAVENOUS | Status: DC
  Administered 2013-10-09: 500 mL via INTRAVENOUS

## 2013-10-08 MED ORDER — LACTATED RINGERS IV SOLN: 500.0000 mL | Freq: Once | INTRAVENOUS | Status: DC

## 2013-10-08 MED ORDER — TERBUTALINE SULFATE 1 MG/ML IJ SOLN: 0.2500 mg | Freq: Once | INTRAMUSCULAR | Status: AC | PRN

## 2013-10-08 MED ORDER — OXYTOCIN 40 UNITS IN LACTATED RINGERS INFUSION - SIMPLE MED
62.5000 mL/h | INTRAVENOUS | Status: DC
  Filled 2013-10-08: qty 1000

## 2013-10-08 MED ORDER — PHENYLEPHRINE 40 MCG/ML (10ML) SYRINGE FOR IV PUSH (FOR BLOOD PRESSURE SUPPORT)
80.0000 ug | PREFILLED_SYRINGE | INTRAVENOUS | Status: DC | PRN
Start: 2013-10-08 — End: 2013-10-09
  Filled 2013-10-08: qty 5
  Filled 2013-10-08: qty 2

## 2013-10-08 MED ORDER — ACETAMINOPHEN 325 MG PO TABS: 650.0000 mg | ORAL_TABLET | ORAL | Status: DC | PRN

## 2013-10-08 MED ORDER — LACTATED RINGERS IV SOLN
500.0000 mL | INTRAVENOUS | Status: DC | PRN
  Administered 2013-10-09: 500 mL via INTRAVENOUS

## 2013-10-08 NOTE — Progress Notes (Signed)
[id number] 

## 2013-10-08 NOTE — Patient Instructions (Signed)
Return to clinic for any obstetric concerns or go to [facility] for evaluation  

## 2013-10-08 NOTE — Progress Notes (Signed)
Patient reports feeling nauseated and does not "feel right".  BP 143/90  This is the third occasion of DBP ? 90 mmHg, and SBP is 143 today. She has a history of GHTN last pregnancy.  Given the current diagnosis of GHTN and Bishop's score of 8, > [redacted] weeks GA, I recommended induction of labor.  NST performed in clinic was reactive.  [facility] staff on call notified.

## 2013-10-08 NOTE — Progress Notes (Signed)
Whitney Rodriguez is a 27 y.o. [id number] at [redacted]w[redacted]d admitted for induction of labor due to gestational HTN.  Subjective:  Pt started contracting on cytotec. Somewhat uncomfortable. +FM. No LOF, VB.   No ha, sob, lee, vision changes, ruq pain.   Objective: BP 137/82  Pulse 78  Temp(Src) 97.8 F (36.6 C) (Oral)  Resp 18  Ht 5\' 1"  (1.549 m)  Wt 82.555 kg (182 lb)  BMI 34.41 kg/m2  LMP 01/20/2013      FHT:  FHR: 130 bpm, variability: moderate,  accelerations:  Present,  decelerations:  Absent UC:   regular, every 2-3 minutes SVE:   Dilation: 1.5 Effacement (%): 70 Station: -2 Exam by:: Dr. Reola Calkins  Labs: Lab Results  Component Value Date   WBC 13.5* 10/08/2013   HGB 13.2 10/08/2013   HCT 39.3 10/08/2013   MCV 86.4 10/08/2013   PLT 229 10/08/2013    Assessment / Plan: IOL due to gHTN. s/p cytotec x 1 and now contracting  Labor: FB placed and inflated with 60cc of LR.  cont monitoring now given contraction pattern Fetal Wellbeing:  Category I Pain Control:  Fentanyl I/D:  n/a Anticipated MOD:  NSVD  [name] L 10/08/2013, 10:47 PM

## 2013-10-08 NOTE — H&P (Signed)
Shaira Sova is a 27 y.o. female [phone] with IUP at [redacted]w[redacted]d presenting for IOL for gHTN.   Ms. Schrecengost was seen in clinic this AM and had at that time pressures in the 140s/90s.  She had previously been normotensive until the last 4 weeks when she continued to have pressures that increased.  She has otherwise been asymptomatic- denies fevers, chills, HA, vision changes, RUQ pain. She did have an episode of nausea overnight which has since resolved.  Given her BPs, she was sent from clinic for admission and induction.  She is having irregular contractions. No vb, LOF.  +FM.  [facility] 9 w[facility]. 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 Otherwise no significant findings this pregnancy.    Prenatal History/Complications:  Past Medical History: Past Medical History  Diagnosis Date  . Cystic fibrosis gene carrier     fob is negative for gene  . Medical history non-contributory     Past Surgical History: History reviewed. No pertinent past surgical history.  Obstetrical History: OB History   Grav Para Term Preterm Abortions TAB SAB Ect Mult Living   5 2 2  0 2 2 0 0 0 2    G1- TAB G2-TAB G3- NSVD, 7.5lb G4- NSVD, 7lb 7oz.  G5-current   Social History: History   Social History  . Marital Status: Single    Spouse Name: N/A    Number of Children: N/A  . Years of Education: N/A   Social History Main Topics  . Smoking status: Former Smoker    Quit date: 02/28/2013  . Smokeless tobacco: None  . Alcohol Use: No     Comment: Using electronic cigarette rarely at this point/ pt is social drinker when not pregnant.  . Drug Use: No  . Sexual Activity: Yes   Other Topics Concern  . None   Social History Narrative  . None    Family History: History reviewed. No pertinent family history.  Allergies: No Known Allergies  Prescriptions prior to admission  Medication Sig Dispense Refill  . calcium carbonate (TUMS - DOSED IN MG ELEMENTAL CALCIUM) 500 MG  chewable tablet Chew 1 tablet by mouth 2 (two) times daily as needed for heartburn.      . Pediatric Multiple Vit-C-FA (FLINSTONES GUMMIES OMEGA-3 DHA) CHEW Chew 2 each by mouth daily.          Review of Systems   Constitutional: Negative for fever, chills, weight loss, malaise/fatigue and diaphoresis.  HENT: Negative for hearing loss, ear pain, nosebleeds, congestion, sore throat, neck pain, tinnitus and ear discharge.   Eyes: Negative for blurred vision, double vision, photophobia, pain, discharge and redness.  Respiratory: Negative for cough, hemoptysis, sputum production, shortness of breath, wheezing and stridor.   Cardiovascular: Negative for chest pain, palpitations, orthopnea,  leg swelling  Gastrointestinal: Positive for abdominal pain. Negative for heartburn, nausea, vomiting, diarrhea, constipation, blood in stool Genitourinary: Negative for dysuria, urgency, frequency, hematuria and flank pain.  Musculoskeletal: Negative for myalgias, back pain, joint pain and falls.  Skin: Negative for itching and rash.  Neurological: Negative for dizziness, tingling, tremors, sensory change, speech change, focal weakness, seizures, loss of consciousness, weakness and headaches.  Endo/Heme/Allergies: Negative for environmental allergies and polydipsia. Does not bruise/bleed easily.  Psychiatric/Behavioral: Negative for depression, suicidal ideas, hallucinations, memory loss and substance abuse. The patient is not nervous/anxious and does not have insomnia.       Blood pressure 131/91, pulse 72, temperature 97.8 F (36.6 C),  temperature source Oral, resp. rate 20, height 5\' 1"  (1.549 m), weight 82.555 kg (182 lb), last menstrual period 01/20/2013. General appearance: alert, cooperative and no distress Lungs: clear to auscultation bilaterally Heart: regular rate and rhythm Abdomen: soft, non-tender; bowel sounds normal Pelvic: 1.5/60/-2 Extremities: Homans sign is negative, no sign of  DVT Presentation: cephalic Fetal monitoringBaseline: 130 bpm, Variability: Good {> 6 bpm), Accelerations: Reactive and Decelerations: Absent Uterine activityNone Dilation: 1.5 Effacement (%): 70 Station: -2 Exam by:: Dr. Reola Calkins   Prenatal labs: ABO, Rh: A/POS/-- (03/20 1517) Antibody: NEG (03/20 1517) Rubella:   RPR: NON REAC (08/04 1637)  HBsAg: NEGATIVE (03/20 1517)  HIV: NON REACTIVE (08/04 1637)  GBS: Negative (10/20 1714)  1 hr Glucola 113 Genetic screening  declined Anatomy US pyelectasis initially that resolved by 33 weeks.    Assessment: Adamarie Izzo is a 27 y.o. [phone] with an IUP at [redacted]w[redacted]d presenting for IOL for gHTN  Plan: 1) IOL for gHTN - admit to L&D - routine orders - fentanyl for pain - start with cytotec for cervical ripening  2) FWB - cat I tracing - GBS neg - EFW 6.5lb  3) anticipate SVD.    Vale Haven, MD 10/08/2013, 9:13 PM

## 2013-10-09 ENCOUNTER — Encounter: Payer: No Typology Code available for payment source | Admitting: Family Medicine

## 2013-10-09 ENCOUNTER — Inpatient Hospital Stay (HOSPITAL_COMMUNITY): Payer: No Typology Code available for payment source | Admitting: Anesthesiology

## 2013-10-09 ENCOUNTER — Encounter (HOSPITAL_COMMUNITY)
Admission: AD | Disposition: A | Discharge status: Home / Self Care | Payer: Self-pay | Source: Ambulatory Visit | Attending: Obstetrics & Gynecology

## 2013-10-09 ENCOUNTER — Encounter (HOSPITAL_COMMUNITY): Payer: No Typology Code available for payment source | Admitting: Anesthesiology

## 2013-10-09 ENCOUNTER — Encounter (HOSPITAL_COMMUNITY): Payer: Self-pay | Admitting: Anesthesiology

## 2013-10-09 DIAGNOSIS — O139 Gestational [pregnancy-induced] hypertension without significant proteinuria, unspecified trimester: Secondary | ICD-10-CM

## 2013-10-09 DIAGNOSIS — O0940 Supervision of pregnancy with grand multiparity, unspecified trimester: Secondary | ICD-10-CM

## 2013-10-09 HISTORY — PX: TUBAL LIGATION: SHX77

## 2013-10-09 LAB — PROTEIN / CREATININE RATIO, URINE
Creatinine, Urine: 77.47 mg/dL
Total Protein, Urine: 8.9 mg/dL

## 2013-10-09 LAB — CBC
Hemoglobin: 13.1 g/dL (ref 12.0–15.0)
MCV: 86 fL (ref 78.0–100.0)
RBC: 4.44 MIL/uL (ref 3.87–5.11)
WBC: 11.2 10*3/uL — ABNORMAL HIGH (ref 4.0–10.5)

## 2013-10-09 LAB — SURGICAL PCR SCREEN: MRSA, PCR: NEGATIVE

## 2013-10-09 SURGERY — LIGATION, FALLOPIAN TUBE, POSTPARTUM
Anesthesia: Epidural | Site: Abdomen | Laterality: Bilateral | Wound class: Clean

## 2013-10-09 MED ORDER — DIPHENHYDRAMINE HCL 25 MG PO CAPS: 25.0000 mg | ORAL_CAPSULE | Freq: Four times a day (QID) | ORAL | Status: DC | PRN

## 2013-10-09 MED ORDER — WITCH HAZEL-GLYCERIN EX PADS: 1.0000 "application " | MEDICATED_PAD | CUTANEOUS | Status: DC | PRN

## 2013-10-09 MED ORDER — METOCLOPRAMIDE HCL 5 MG/ML IJ SOLN: 10.0000 mg | Freq: Once | INTRAMUSCULAR | Status: DC | PRN

## 2013-10-09 MED ORDER — FENTANYL CITRATE 0.05 MG/ML IJ SOLN
INTRAMUSCULAR | Status: AC
  Administered 2013-10-09: 50 ug via INTRAVENOUS
  Filled 2013-10-09: qty 2

## 2013-10-09 MED ORDER — MEPERIDINE HCL 25 MG/ML IJ SOLN: 6.2500 mg | INTRAMUSCULAR | Status: DC | PRN

## 2013-10-09 MED ORDER — LIDOCAINE-EPINEPHRINE (PF) 2 %-1:200000 IJ SOLN
INTRAMUSCULAR | Status: AC
  Filled 2013-10-09: qty 20

## 2013-10-09 MED ORDER — IBUPROFEN 600 MG PO TABS
600.0000 mg | ORAL_TABLET | Freq: Four times a day (QID) | ORAL | Status: DC
  Administered 2013-10-09 – 2013-10-10 (×3): 600 mg via ORAL
  Filled 2013-10-09 (×3): qty 1

## 2013-10-09 MED ORDER — LANOLIN HYDROUS EX OINT: TOPICAL_OINTMENT | CUTANEOUS | Status: DC | PRN

## 2013-10-09 MED ORDER — SODIUM BICARBONATE 8.4 % IV SOLN
INTRAVENOUS | Status: DC | PRN
  Administered 2013-10-09 (×4): 5 mL via EPIDURAL

## 2013-10-09 MED ORDER — LACTATED RINGERS IV SOLN
INTRAVENOUS | Status: DC
  Administered 2013-10-09: 12:00:00 via INTRAVENOUS
  Administered 2013-10-09: 10 mL/h via INTRAVENOUS

## 2013-10-09 MED ORDER — MIDAZOLAM HCL 2 MG/2ML IJ SOLN
INTRAMUSCULAR | Status: DC | PRN
  Administered 2013-10-09: 2 mg via INTRAVENOUS

## 2013-10-09 MED ORDER — OXYCODONE-ACETAMINOPHEN 5-325 MG PO TABS
1.0000 | ORAL_TABLET | ORAL | Status: DC | PRN
  Administered 2013-10-09: 1 via ORAL
  Administered 2013-10-09 – 2013-10-10 (×2): 2 via ORAL
  Administered 2013-10-10: 1 via ORAL
  Filled 2013-10-09: qty 2
  Filled 2013-10-09: qty 1
  Filled 2013-10-09: qty 2
  Filled 2013-10-09: qty 1

## 2013-10-09 MED ORDER — MIDAZOLAM HCL 2 MG/2ML IJ SOLN
INTRAMUSCULAR | Status: AC
  Filled 2013-10-09: qty 2

## 2013-10-09 MED ORDER — BUPIVACAINE HCL 0.5 % IJ SOLN
INTRAMUSCULAR | Status: DC | PRN
  Administered 2013-10-09: 20 mL
  Administered 2013-10-09: 10 mL

## 2013-10-09 MED ORDER — KETOROLAC TROMETHAMINE 30 MG/ML IJ SOLN
15.0000 mg | Freq: Once | INTRAMUSCULAR | Status: AC | PRN
  Administered 2013-10-09: 30 mg via INTRAVENOUS

## 2013-10-09 MED ORDER — TETANUS-DIPHTH-ACELL PERTUSSIS 5-2.5-18.5 LF-MCG/0.5 IM SUSP: 0.5000 mL | Freq: Once | INTRAMUSCULAR | Status: DC

## 2013-10-09 MED ORDER — ZOLPIDEM TARTRATE 5 MG PO TABS: 5.0000 mg | ORAL_TABLET | Freq: Every evening | ORAL | Status: DC | PRN

## 2013-10-09 MED ORDER — FAMOTIDINE 20 MG PO TABS
40.0000 mg | ORAL_TABLET | Freq: Once | ORAL | Status: AC
  Administered 2013-10-09: 40 mg via ORAL
  Filled 2013-10-09: qty 2

## 2013-10-09 MED ORDER — FENTANYL CITRATE 0.05 MG/ML IJ SOLN
25.0000 ug | INTRAMUSCULAR | Status: DC | PRN
  Administered 2013-10-09 (×2): 50 ug via INTRAVENOUS

## 2013-10-09 MED ORDER — FENTANYL CITRATE 0.05 MG/ML IJ SOLN
INTRAMUSCULAR | Status: AC
  Filled 2013-10-09: qty 2

## 2013-10-09 MED ORDER — METOCLOPRAMIDE HCL 10 MG PO TABS
10.0000 mg | ORAL_TABLET | Freq: Once | ORAL | Status: AC
  Administered 2013-10-09: 10 mg via ORAL
  Filled 2013-10-09: qty 1

## 2013-10-09 MED ORDER — PRENATAL MULTIVITAMIN CH: 1.0000 | ORAL_TABLET | Freq: Every day | ORAL | Status: DC

## 2013-10-09 MED ORDER — SENNOSIDES-DOCUSATE SODIUM 8.6-50 MG PO TABS
2.0000 | ORAL_TABLET | ORAL | Status: DC
  Administered 2013-10-09: 2 via ORAL
  Filled 2013-10-09: qty 2

## 2013-10-09 MED ORDER — SIMETHICONE 80 MG PO CHEW: 80.0000 mg | CHEWABLE_TABLET | ORAL | Status: DC | PRN

## 2013-10-09 MED ORDER — BUPIVACAINE HCL (PF) 0.5 % IJ SOLN
INTRAMUSCULAR | Status: AC
  Filled 2013-10-09: qty 30

## 2013-10-09 MED ORDER — KETOROLAC TROMETHAMINE 30 MG/ML IJ SOLN
INTRAMUSCULAR | Status: AC
  Administered 2013-10-09: 30 mg via INTRAVENOUS
  Filled 2013-10-09: qty 1

## 2013-10-09 MED ORDER — LIDOCAINE HCL (PF) 1 % IJ SOLN
INTRAMUSCULAR | Status: DC | PRN
  Administered 2013-10-09 (×2): 4 mL

## 2013-10-09 MED ORDER — LACTATED RINGERS IV SOLN
INTRAVENOUS | Status: DC | PRN
  Administered 2013-10-09: 10:00:00 via INTRAVENOUS

## 2013-10-09 MED ORDER — DIBUCAINE 1 % RE OINT: 1.0000 "application " | TOPICAL_OINTMENT | RECTAL | Status: DC | PRN

## 2013-10-09 MED ORDER — ONDANSETRON HCL 4 MG/2ML IJ SOLN: 4.0000 mg | INTRAMUSCULAR | Status: DC | PRN

## 2013-10-09 MED ORDER — FENTANYL CITRATE 0.05 MG/ML IJ SOLN
INTRAMUSCULAR | Status: DC | PRN
  Administered 2013-10-09 (×2): 50 ug via INTRAVENOUS

## 2013-10-09 MED ORDER — ONDANSETRON HCL 4 MG PO TABS: 4.0000 mg | ORAL_TABLET | ORAL | Status: DC | PRN

## 2013-10-09 MED ORDER — FENTANYL 2.5 MCG/ML BUPIVACAINE 1/10 % EPIDURAL INFUSION (WH - ANES)
INTRAMUSCULAR | Status: DC | PRN
  Administered 2013-10-09: 12 mL/h via EPIDURAL

## 2013-10-09 MED ORDER — BENZOCAINE-MENTHOL 20-0.5 % EX AERO
1.0000 "application " | INHALATION_SPRAY | CUTANEOUS | Status: DC | PRN
  Administered 2013-10-09: 1 via TOPICAL
  Filled 2013-10-09: qty 56

## 2013-10-09 SURGICAL SUPPLY — 22 items
APL SKNCLS STERI-STRIP NONHPOA (GAUZE/BANDAGES/DRESSINGS) ×1
BENZOIN TINCTURE PRP APPL 2/3 (GAUZE/BANDAGES/DRESSINGS) ×1 IMPLANT
CATH ROBINSON RED A/P 16FR (CATHETERS) ×1 IMPLANT
CHLORAPREP W/TINT 26ML (MISCELLANEOUS) ×2 IMPLANT
CLIP FILSHIE TUBAL LIGA STRL (Clip) ×1 IMPLANT
CLOTH BEACON ORANGE TIMEOUT ST (SAFETY) ×2 IMPLANT
DRSG COVADERM PLUS 2X2 (GAUZE/BANDAGES/DRESSINGS) ×1 IMPLANT
GLOVE ECLIPSE 7.0 STRL STRAW (GLOVE) ×2 IMPLANT
GLOVE INDICATOR 7.0 STRL GRN (GLOVE) ×4 IMPLANT
GOWN PREVENTION PLUS LG XLONG (DISPOSABLE) ×2 IMPLANT
GOWN STRL REIN XL XLG (GOWN DISPOSABLE) ×2 IMPLANT
NDL HYPO 25X1 1.5 SAFETY (NEEDLE) IMPLANT
NEEDLE HYPO 25X1 1.5 SAFETY (NEEDLE) IMPLANT
NS IRRIG 1000ML POUR BTL (IV SOLUTION) ×2 IMPLANT
PACK ABDOMINAL MINOR (CUSTOM PROCEDURE TRAY) ×2 IMPLANT
STRIP CLOSURE SKIN 1/4X3 (GAUZE/BANDAGES/DRESSINGS) ×1 IMPLANT
SUT VIC AB 0 CT1 27 (SUTURE) ×2
SUT VIC AB 0 CT1 27XBRD ANBCTR (SUTURE) ×1 IMPLANT
SUT VIC AB 4-0 PS2 27 (SUTURE) ×2 IMPLANT
SYR CONTROL 10ML LL (SYRINGE) IMPLANT
TOWEL OR 17X24 6PK STRL BLUE (TOWEL DISPOSABLE) ×4 IMPLANT
WATER STERILE IRR 1000ML POUR (IV SOLUTION) ×1 IMPLANT

## 2013-10-09 NOTE — Transfer of Care (Signed)
Immediate Anesthesia Transfer of Care Note  Patient: Whitney Rodriguez  Procedure(s) Performed: Procedure(s): POST PARTUM TUBAL LIGATION (Bilateral)  Patient Location: PACU  Anesthesia Type:Epidural  Level of Consciousness: awake, alert  and oriented  Airway & Oxygen Therapy: Patient Spontanous Breathing  Post-op Assessment: Report given to PACU RN  Post vital signs: Reviewed and stable  Complications: No apparent anesthesia complications

## 2013-10-09 NOTE — Anesthesia Procedure Notes (Signed)
Epidural Patient location during procedure: OB Start time: 10/09/2013 12:18 AM  Staffing Anesthesiologist: Leniyah Martell A. Performed by: anesthesiologist   Preanesthetic Checklist Completed: patient identified, site marked, surgical consent, pre-op evaluation, timeout performed, IV checked, risks and benefits discussed and monitors and equipment checked  Epidural Patient position: sitting Prep: site prepped and draped and DuraPrep Patient monitoring: continuous pulse ox and blood pressure Approach: midline Injection technique: LOR air  Needle:  Needle type: Tuohy  Needle gauge: 17 G Needle length: 9 cm and 9 Needle insertion depth: 5 cm cm Catheter type: closed end flexible Catheter size: 19 Gauge Catheter at skin depth: 10 cm Test dose: negative and Other  Assessment Events: blood not aspirated, injection not painful, no injection resistance, negative IV test and no paresthesia  Additional Notes Patient identified. Risks and benefits discussed including failed block, incomplete  Pain control, post dural puncture headache, nerve damage, paralysis, blood pressure Changes, nausea, vomiting, reactions to medications-both toxic and allergic and post Partum back pain. All questions were answered. Patient expressed understanding and wished to proceed. Sterile technique was used throughout procedure. Epidural site was Dressed with sterile barrier dressing. No paresthesias, signs of intravascular injection Or signs of intrathecal spread were encountered.  Patient was more comfortable after the epidural was dosed. Please see RN's note for documentation of vital signs and FHR which are stable.

## 2013-10-09 NOTE — Brief Op Note (Signed)
10/08/2013 - 10/09/2013  10:56 AM  PATIENT:  Madolyn Frieze  27 y.o. female  PRE-OPERATIVE DIAGNOSIS:  desire sterilization  POST-OPERATIVE DIAGNOSIS:  desire sterilization  PROCEDURE:  Procedure(s): POST PARTUM TUBAL LIGATION (Bilateral)  SURGEON:  Surgeon(s) and Role:    * Willodean Rosenthal, MD - Primary  PHYSICIAN ASSISTANT:   ASSISTANTS: none   ANESTHESIA:   local and epidural  EBL:     BLOOD ADMINISTERED:none  DRAINS: none   LOCAL MEDICATIONS USED:  MARCAINE     SPECIMEN:  No Specimen  DISPOSITION OF SPECIMEN:  N/A  COUNTS:  YES  TOURNIQUET:  * No tourniquets in log *  DICTATION: .Note written in EPIC  PLAN OF CARE: The patient is already an inpatient   PATIENT DISPOSITION:  PACU - hemodynamically stable.   Delay start of Pharmacological VTE agent (>24hrs) due to surgical blood loss or risk of bleeding: yes

## 2013-10-09 NOTE — Anesthesia Preprocedure Evaluation (Addendum)
Anesthesia Evaluation  Patient identified by MRN, date of birth, ID band Patient awake    Reviewed: Allergy & Precautions, H&P , Patient's Chart, lab work & pertinent test results  Airway Mallampati: III TM Distance: >3 FB Neck ROM: Full    Dental no notable dental hx. (+) Teeth Intact   Pulmonary former smoker,  CF carrier breath sounds clear to auscultation  Pulmonary exam normal       Cardiovascular hypertension, Rhythm:Regular Rate:Normal     Neuro/Psych negative neurological ROS  negative psych ROS   GI/Hepatic Neg liver ROS, GERD-  Medicated and Controlled,  Endo/Other  Obesity   Renal/GU negative Renal ROS     Musculoskeletal negative musculoskeletal ROS (+)   Abdominal (+) + obese,   Peds  Hematology negative hematology ROS (+)   Anesthesia Other Findings Had epidural for labor that worked well - left side worked before right, but both sides worked eventually  Reproductive/Obstetrics (+) Pregnancy (s/p SVD on 01/09/13 at 0241, for PPBTL) and Breast feeding                          Anesthesia Physical  Anesthesia Plan  ASA: II  Anesthesia Plan: Epidural   Post-op Pain Management:    Induction:   Airway Management Planned: Natural Airway  Additional Equipment:   Intra-op Plan:   Post-operative Plan:   Informed Consent: I have reviewed the patients History and Physical, chart, labs and discussed the procedure including the risks, benefits and alternatives for the proposed anesthesia with the patient or authorized representative who has indicated his/her understanding and acceptance.     Plan Discussed with: Anesthesiologist, CRNA and Surgeon  Anesthesia Plan Comments:        Anesthesia Quick Evaluation

## 2013-10-09 NOTE — Lactation Note (Signed)
This note was copied from the chart of [name]. Lactation Consultation Note: Initial visit with mom. She reports that she has breast fed her other babies for 7 and 10 Jacorie Ernsberger. Reports that this baby has been nursing well. Baby asleep in her arms and she is eating lunch after tubal this morning. BF brochure given with resources for support after DC. No questions at present. To call prn  Patient Name: [name] [id number] Date: 10/09/2013 Reason for consult: Initial assessment   Maternal Data Formula Feeding for Exclusion: No Infant to breast within first hour of birth: Yes Does the patient have breastfeeding experience prior to this delivery?: Yes  Feeding    LATCH Score/Interventions                      Lactation Tools Discussed/Used     Consult Status Consult Status: Follow-up Date: 10/10/13 Follow-up type: In-patient    Pamelia Hoit 10/09/2013, 3:16 PM

## 2013-10-09 NOTE — Anesthesia Postprocedure Evaluation (Signed)
  Anesthesia Post-op Note  Anesthesia Post Note  Patient: Whitney Rodriguez  Procedure(s) Performed: Procedure(s) (LRB): POST PARTUM TUBAL LIGATION (Bilateral)  Anesthesia type: Epidural  Patient location: PACU  Post pain: Pain level controlled  Post assessment: Post-op Vital signs reviewed  Last Vitals:  Filed Vitals:   10/09/13 1215  BP: 131/78  Pulse: 58  Temp:   Resp: 20    Post vital signs: stable  Level of consciousness: awake  Complications: No apparent anesthesia complications

## 2013-10-09 NOTE — Op Note (Signed)
Whitney Rodriguez 10/08/2013 - 10/09/2013  PREOPERATIVE DIAGNOSIS:  Multiparity, undesired fertility  POSTOPERATIVE DIAGNOSIS:  Multiparity, undesired fertility  PROCEDURE:  Postpartum Bilateral Tubal Sterilization using Filshie Clips   ANESTHESIA:  Epidural and local analgesia using 0.5% Marcaine  COMPLICATIONS:  None immediate.  ESTIMATED BLOOD LOSS: 5 ml.  INDICATIONS: 27 y.o. [id number]  with undesired fertility,status post vaginal delivery, desires permanent sterilization.  Other reversible forms of contraception were discussed with patient; she declines all other modalities. Risks of procedure discussed with patient including but not limited to: risk of regret, permanence of method, bleeding, infection, injury to surrounding organs and need for additional procedures.  Failure risk of 0.5-1% with increased risk of ectopic gestation if pregnancy occurs was also discussed with patient.     FINDINGS:  Normal uterus, tubes, and ovaries.  PROCEDURE DETAILS: The patient was taken to the operating room where her epidural anesthesia was dosed up to surgical level and found to be adequate.  She was then placed in the dorsal supine position and prepped and draped in sterile fashion.  After an adequate timeout was performed, attention was turned to the patient's abdomen where a small transverse skin incision was made under the umbilical fold. The incision was taken down to the layer of fascia using the scalpel, and fascia was incised, and extended bilaterally using Mayo scissors. The peritoneum was entered in a sharp fashion. Attention was then turned to the patient's uterus, and left fallopian tube was identified and followed out to the fimbriated end.  A Filshie clip was placed on the left fallopian tube about 3 cm from the cornual attachment, with care given to incorporate the underlying mesosalpinx.  A similar process was carried out on the right side allowing for bilateral tubal sterilization.  Good  hemostasis was noted overall.  Local analgesia was injected into both Filshie application sites.The instruments were then removed from the patient's abdomen and the fascial incision was repaired with 0 Vicryl, and the skin was closed with a 4-0 Vicryl subcuticular stitch. The patient tolerated the procedure well.  Instrument, sponge, and needle counts were correct times two.  The patient was then taken to the recovery room awake and in stable condition.

## 2013-10-09 NOTE — Anesthesia Preprocedure Evaluation (Signed)
Anesthesia Evaluation  Patient identified by MRN, date of birth, ID band Patient awake    Reviewed: Allergy & Precautions, H&P , Patient's Chart, lab work & pertinent test results  Airway Mallampati: III TM Distance: >3 FB Neck ROM: Full    Dental no notable dental hx. (+) Teeth Intact   Pulmonary former smoker,  CF carrier breath sounds clear to auscultation  Pulmonary exam normal       Cardiovascular hypertension, Rhythm:Regular Rate:Normal     Neuro/Psych negative neurological ROS  negative psych ROS   GI/Hepatic Neg liver ROS, GERD-  Medicated and Controlled,  Endo/Other  Obesity   Renal/GU negative Renal ROS     Musculoskeletal negative musculoskeletal ROS (+)   Abdominal (+) + obese,   Peds  Hematology negative hematology ROS (+)   Anesthesia Other Findings   Reproductive/Obstetrics (+) Pregnancy                           Anesthesia Physical Anesthesia Plan  ASA: II  Anesthesia Plan: Epidural   Post-op Pain Management:    Induction:   Airway Management Planned: Natural Airway  Additional Equipment:   Intra-op Plan:   Post-operative Plan:   Informed Consent: I have reviewed the patients History and Physical, chart, labs and discussed the procedure including the risks, benefits and alternatives for the proposed anesthesia with the patient or authorized representative who has indicated his/her understanding and acceptance.     Plan Discussed with: Anesthesiologist  Anesthesia Plan Comments:         Anesthesia Quick Evaluation

## 2013-10-09 NOTE — Anesthesia Postprocedure Evaluation (Signed)
  Anesthesia Post-op Note  Patient: Whitney Rodriguez  Procedure(s) Performed: Procedure(s): POST PARTUM TUBAL LIGATION (Bilateral)  Patient Location: Mother/Baby  Anesthesia Type:Epidural  Level of Consciousness: awake, alert  and oriented  Airway and Oxygen Therapy: Patient Spontanous Breathing  Post-op Pain: none  Post-op Assessment: Post-op Vital signs reviewed, Patient's Cardiovascular Status Stable, No headache, No backache, No residual numbness and No residual motor weakness  Post-op Vital Signs: Reviewed and stable  Complications: No apparent anesthesia complications

## 2013-10-10 ENCOUNTER — Encounter (HOSPITAL_COMMUNITY): Payer: Self-pay | Admitting: Obstetrics & Gynecology

## 2013-10-10 MED ORDER — OXYCODONE-ACETAMINOPHEN 5-325 MG PO TABS: 1.0000 | ORAL_TABLET | ORAL | Status: DC | PRN

## 2013-10-10 MED ORDER — IBUPROFEN 600 MG PO TABS: 600.0000 mg | ORAL_TABLET | Freq: Four times a day (QID) | ORAL | Status: DC

## 2013-10-10 NOTE — Anesthesia Postprocedure Evaluation (Signed)
  Anesthesia Post-op Note  Patient stable following vaginal delivery.  

## 2013-10-10 NOTE — Progress Notes (Signed)
Discharge  Instructions reviewed with patient and significant  Other.  Patient states understanding of home care, medications, baby care, activity, signs/symptoms to report to MD and return MD office visit for both.  No home equipment needed.  Patient ambulated for discharge in stable condition with baby with staff without incident.

## 2013-10-10 NOTE — Discharge Summary (Signed)
Obstetric Discharge Summary Reason for Admission: induction of labor 2/2 gHTN Prenatal Procedures: none Intrapartum Procedures: spontaneous vaginal delivery and tubal ligation Postpartum Procedures: none Complications-Operative and Postpartum: periurethral laceration Hemoglobin  Date Value Range Status  10/09/2013 13.1  12.0 - 15.0 g/dL Final     HCT  Date Value Range Status  10/09/2013 38.2  36.0 - 46.0 % Final    Physical Exam:  General: alert, cooperative and no distress Lochia: appropriate Uterine Fundus: firm Incision: healing well, no significant drainage, no significant erythema DVT Evaluation: No evidence of DVT seen on physical exam.  Discharge Diagnoses: Term Pregnancy-delivered  Discharge Information: Date: 10/10/2013 Activity: unrestricted Diet: routine Medications: Ibuprofen and Percocet Condition: stable Instructions: refer to practice specific booklet Discharge to: home Follow-up Information   Schedule an appointment as soon as possible for a visit with [facility] [employer] [facility]. (4-6 weeks postpartum)    Specialty:  Obstetrics and Gynecology   Contact information:   [address] [place] [address] [phone]      Newborn Data: Live born female  Birth Weight: 5 lb 11 oz (2580 g) APGAR: 7, 9  Home with mother. Ms Axon is a 27y.o [id number] who presented at 37w2 for IOL 2/2 gHTN. Pt was contracting well on cytotec X1. FB was then placed with adequate dilation. Pt labored down well with good effort. Delivered a viable baby boy after 2 hard pushes with instantaneous cry. Placenta was spontaneously delivered after manual traction. Delivery was complicated by periurethral tear that was hemostatic. EBL approx 250. Pt was then taken to op for BTL. Procedure went well with no complications. Did well post-op passing gas, taking po and voiding well. Still having incisional pain in dermatomal distribution. She is planning to continue  to breast feed. Will f/up at [facility] in 4-6 weeks.   Anselm Lis 10/10/2013, 7:38 AM  I have seen and examined this patient and I agree with the above. Cam Hai 6:37 PM 10/10/2013

## 2013-10-12 NOTE — Discharge Summary (Signed)
Attestation of Attending Supervision of Advanced Practitioner (CNM/NP): Evaluation and management procedures were performed by the Advanced Practitioner under my supervision and collaboration.  I have reviewed the Advanced Practitioner's note and chart, and I agree with the management and plan.  HARRAWAY-SMITH, Benard Minturn 12:01 PM

## 2013-10-13 LAB — RPR: RPR Ser Ql: NONREACTIVE

## 2013-11-27 ENCOUNTER — Ambulatory Visit: Payer: No Typology Code available for payment source | Admitting: Obstetrics & Gynecology

## 2013-11-27 ENCOUNTER — Ambulatory Visit (INDEPENDENT_AMBULATORY_CARE_PROVIDER_SITE_OTHER): Payer: No Typology Code available for payment source | Admitting: Obstetrics & Gynecology

## 2013-11-27 ENCOUNTER — Encounter: Payer: Self-pay | Admitting: Obstetrics & Gynecology

## 2013-11-27 DIAGNOSIS — Z09 Encounter for follow-up examination after completed treatment for conditions other than malignant neoplasm: Secondary | ICD-10-CM

## 2013-11-27 NOTE — Progress Notes (Signed)
   Subjective:    Patient ID: Delaila Nand, female    DOB: [birth year], 27 y.o.   MRN: [id number]  HPI  27 yo P3 here today for her pp visit. She had a NSVD with no repair needed. She then had PPS with [employer] clips. She has no problems. She had sex last week. Her son is breastfeeding and growing. She denies depression and scored 1 on the pp depression test. She report normal bowel and bladder function.  Review of Systems Her pap was normal 4/14.    Objective:   Physical Exam  Normal vulva/vagina      Assessment & Plan:  Post partum- doing well RTC 1 year for annual

## 2013-12-12 ENCOUNTER — Encounter: Payer: No Typology Code available for payment source | Admitting: Family Medicine

## 2014-02-19 ENCOUNTER — Encounter: Payer: Self-pay | Admitting: *Deleted

## 2014-03-13 ENCOUNTER — Telehealth: Payer: Self-pay | Admitting: *Deleted

## 2014-03-13 DIAGNOSIS — B379 Candidiasis, unspecified: Secondary | ICD-10-CM

## 2014-03-13 MED ORDER — FLUCONAZOLE 150 MG PO TABS: 150.0000 mg | ORAL_TABLET | Freq: Every day | ORAL | Status: DC

## 2014-03-13 NOTE — Telephone Encounter (Signed)
Patient was placed on an antibiotic from her primary care and now she has a yeast infection but she is unable to come in for an appointment because she is headed out of town.  We will call in Diflucan for her.

## 2014-10-21 ENCOUNTER — Encounter: Payer: Self-pay | Admitting: Obstetrics & Gynecology

## 2015-03-26 IMAGING — US US OB COMP +14 WK
1 of 2 series · 12 of 28 positions shown · non-contrast
Comparison: none

[Series 1: us ob comp +14 wk · 12 of 77 slices shown]
[im 1/77]
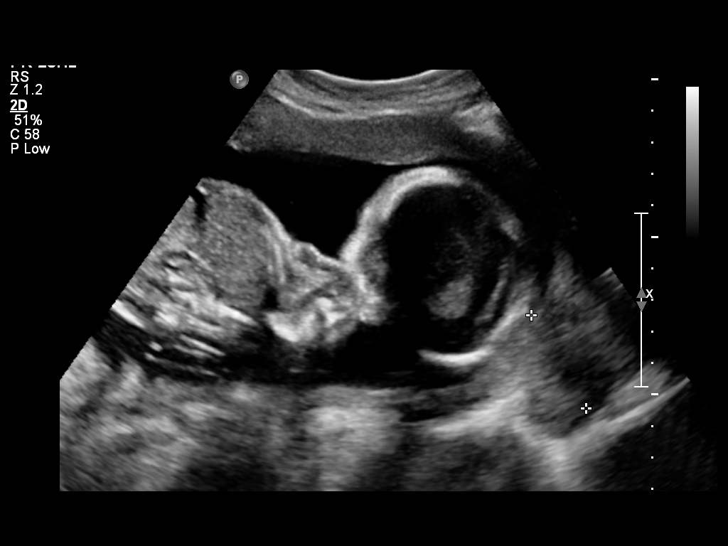
[im 6/77]
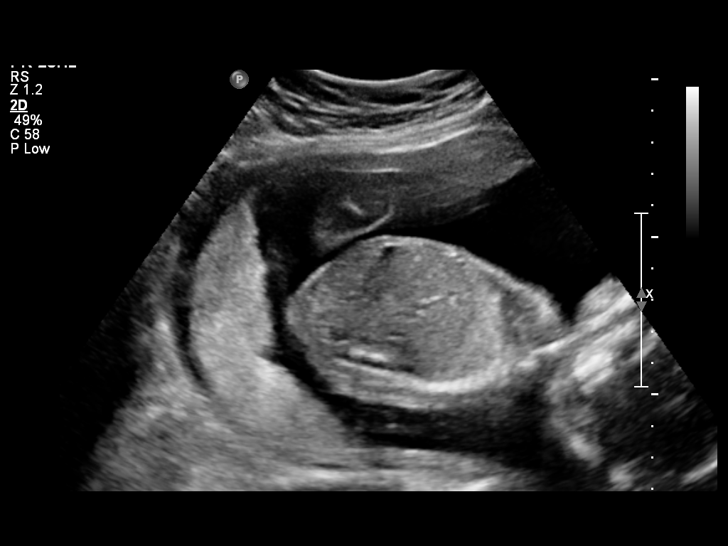
[im 12/77]
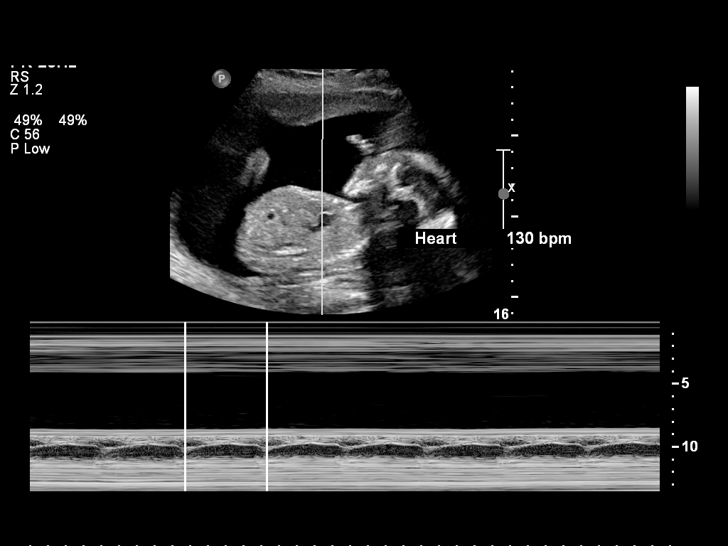
[im 21/77]
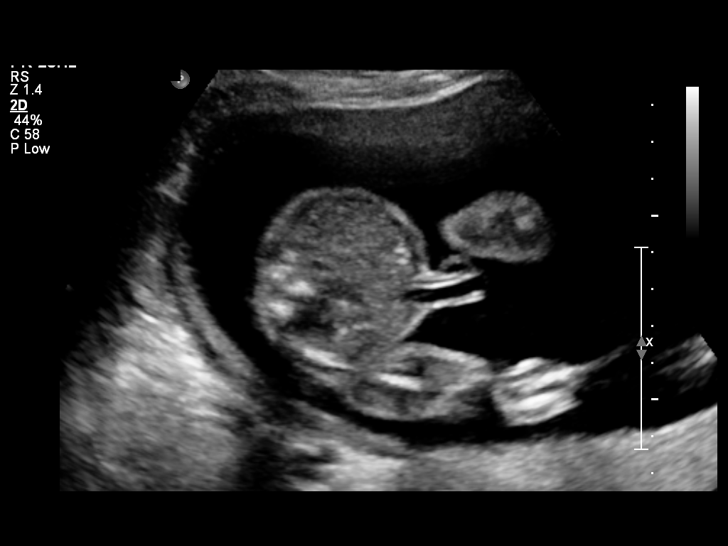
[im 27/77]
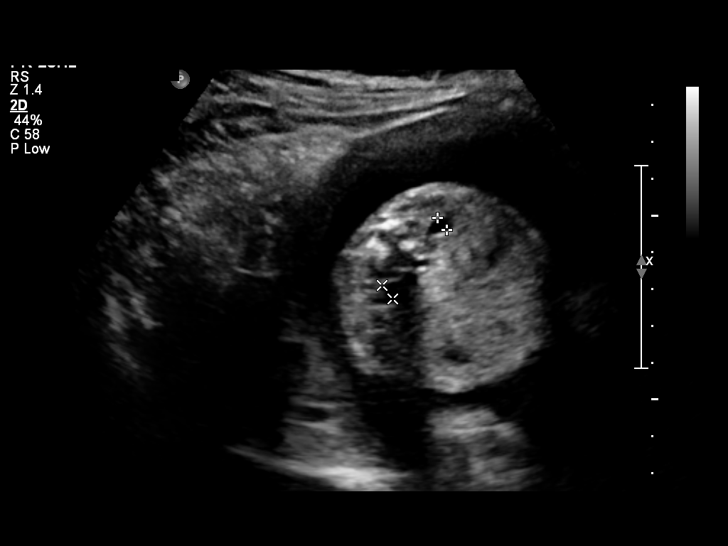
[im 33/77]
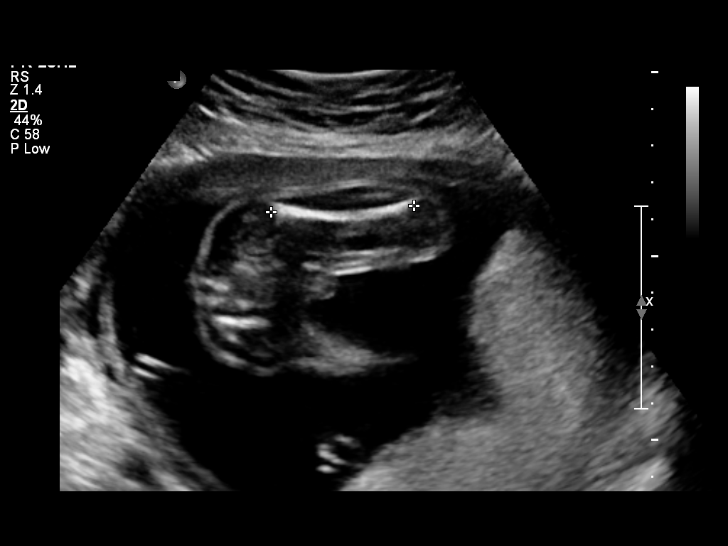
[im 41/77]
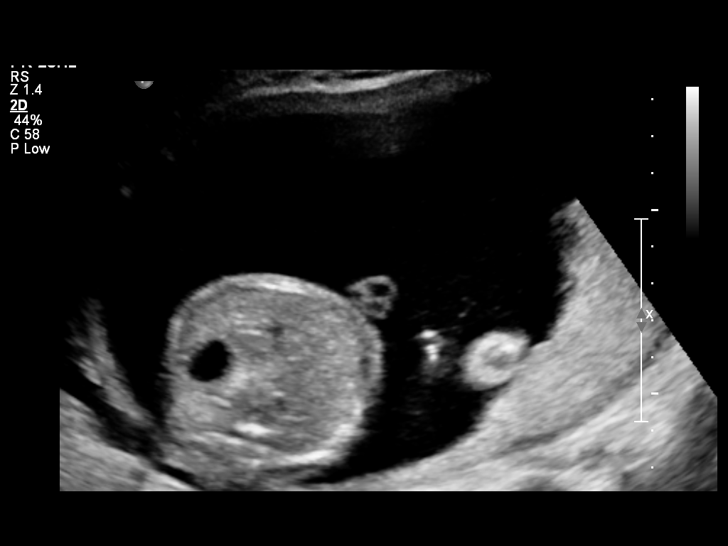
[im 47/77]
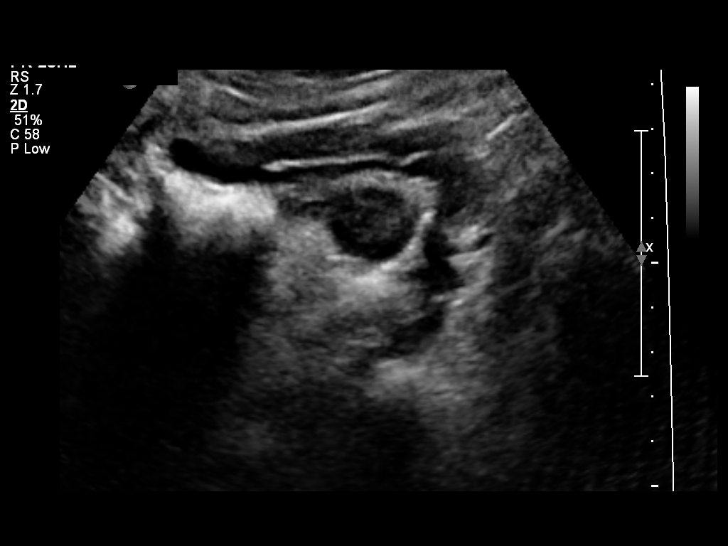
[im 53/77]
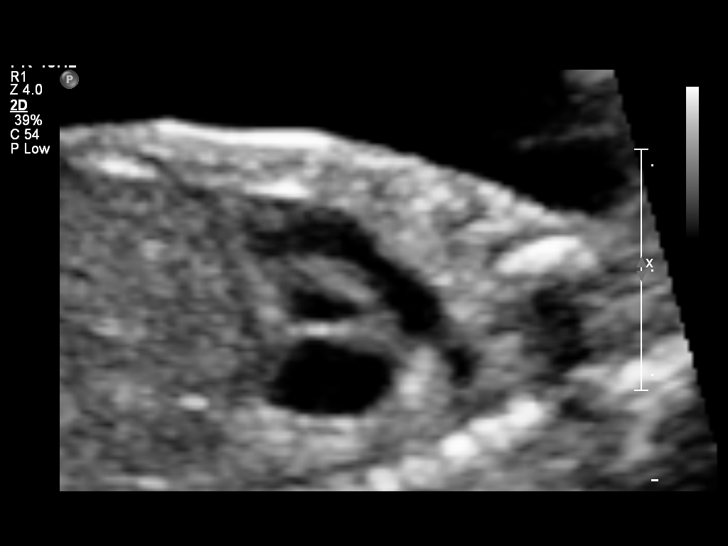
[im 62/77]
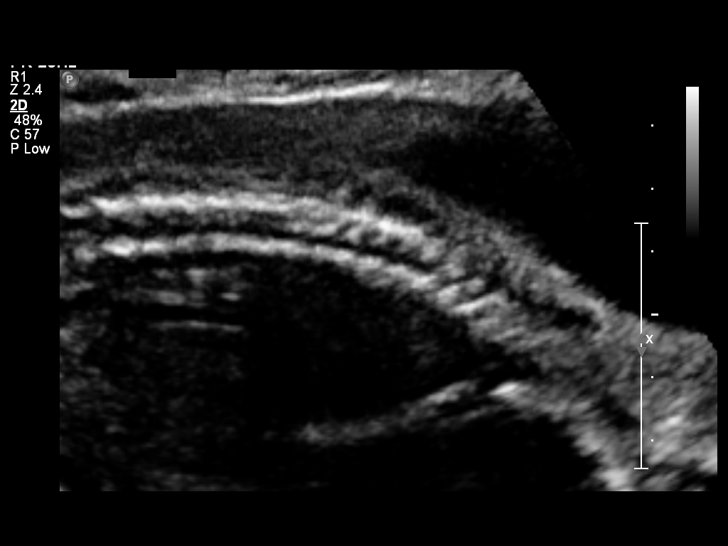
[im 68/77]
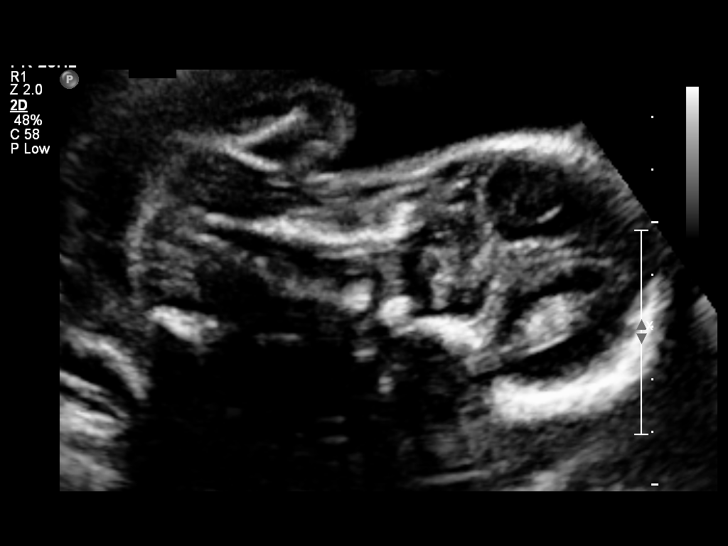
[im 74/77]
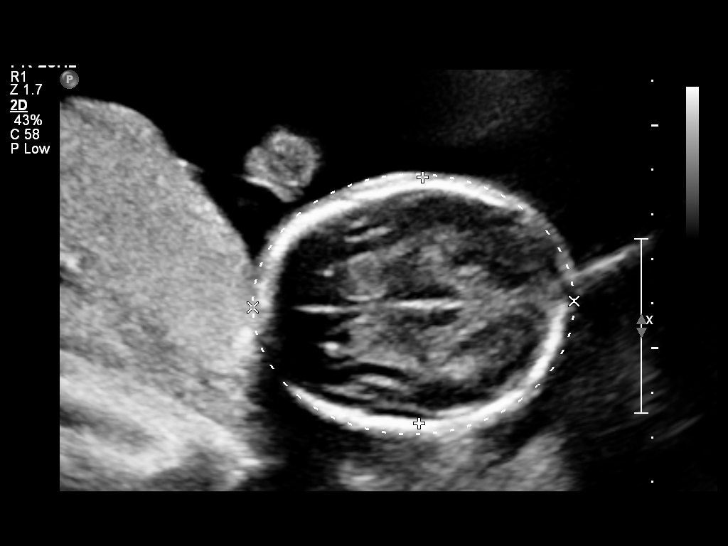

[12 of 28 positions shown; findings below may reference images not displayed]

OBSTETRICS REPORT
                      (Signed Final 07/03/2013 [DATE])

Service(s) Provided

 US OB COMP + 14 WK                                    76805.1
Indications

 Basic anatomic survey
 Cystic fibrosis gene carrier (FOB neg carrier)
Fetal Evaluation

 Num Of Fetuses:    1
 Fetal Heart Rate:  130                         bpm
 Cardiac Activity:  Observed
 Presentation:      Cephalic
 Placenta:          Posterior Fundal, above
                    cervical os
 P. Cord            Visualized, central
 Insertion:

 Amniotic Fluid
 AFI FV:      Subjectively within normal limits
                                             Larg Pckt:     8.3  cm
Biometry

 BPD:     54.7  mm    G. Age:   22w 5d                CI:        73.33   70 - 86
                                                      FL/HC:      18.9   19.2 -

 HC:       203  mm    G. Age:   22w 3d        8  %    HC/AC:      1.10   1.05 -

 AC:     184.7  mm    G. Age:   23w 2d       37  %    FL/BPD:     70.0   71 - 87
 FL:      38.3  mm    G. Age:   22w 2d        9  %    FL/AC:      20.7   20 - 24
 HUM:       36  mm    G. Age:   22w 4d       22  %
 CER:     22.8  mm    G. Age:   21w 3d      < 5  %

 Est. FW:     534  gm      1 lb 3 oz     38  %
Gestational Age

 LMP:           23w 3d       Date:   01/20/13                 EDD:   10/27/13
 U/S Today:     22w 5d                                        EDD:   11/01/13
 Best:          23w 3d    Det. By:   LMP  (01/20/13)          EDD:   10/27/13
2nd Trimester Genetic Sonogram - Trisomy 21 Screening
 Age:                                             27          Risk=1:   680

 Structural anomalies (inc. cardiac):             No
 Echogenic bowel:                                 No
 Pyelectasis:                                     Yes
 Echogenic cardiac foci:                          No
Anatomy

 Cranium:          Appears normal         Aortic Arch:      Appears normal
 Fetal Cavum:      Appears normal         Ductal Arch:      Not well visualized
 Ventricles:       Appears normal         Diaphragm:        Appears normal
 Choroid Plexus:   Appears normal         Stomach:          Appears normal
 Cerebellum:       Appears normal         Abdomen:          Appears normal
 Posterior Fossa:  Appears normal         Abdominal Wall:   Appears nml (cord
                                                            insert, abd wall)
 Nuchal Fold:      Appears normal         Cord Vessels:     Appears normal (3
                                                            vessel cord)
 Face:             Appears normal         Kidneys:          Bilat pyelectasis,
                   (orbits and profile)
                                                            Rt 4 mm, Lt 4 mm
 Lips:             Appears normal         Bladder:          Appears normal
 Heart:            Appears normal         Spine:            Appears normal
                   (4CH, axis, and
                   situs)
 RVOT:             Appears normal         Lower             Appears normal
                                          Extremities:
 LVOT:             Appears normal         Upper             Appears normal
                                          Extremities:

 Other:  Male gender.
Cervix Uterus Adnexa

 Cervical Length:   3.6       cm

 Cervix:       Normal appearance by transabdominal scan.

 Left Ovary:   Within normal limits.
 Right Ovary:  Within normal limits.
Impression

 Single living IUP.  US EGA is concordant with LMP.
 No fetal anomalies seen involving visualized anatomy.
 Mild bilateral fetal renal pyelectasis noted, which is
 considered a soft sonographic marker for Trisomy 21.
Recommendations

 Recommend assessment of clinical risk factors and
 correlation with results of any 1st and 2nd trimester
 aneuploidy screening tests.  If patient is considered low-risk,
 this isolated finding is of unlikely significance.  If patient has
 other risk factors for aneuploidy, genetic counseling and
 further testing should be considered.   This could be

 Continued US followup for fetal renal pyelectasis after 22wks
 GA.
 questions or concerns.

## 2015-06-04 IMAGING — US US OB FOLLOW-UP
1 series · 12 of 28 positions shown · non-contrast
Comparison: none

[Series 1: us ob follow up · 46 acquisitions, 12 frames shown]
[im 2/46]
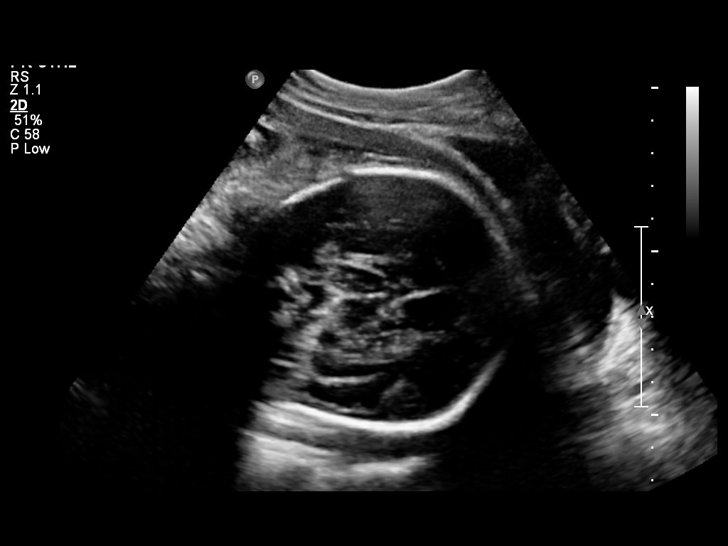
[im 6/46]
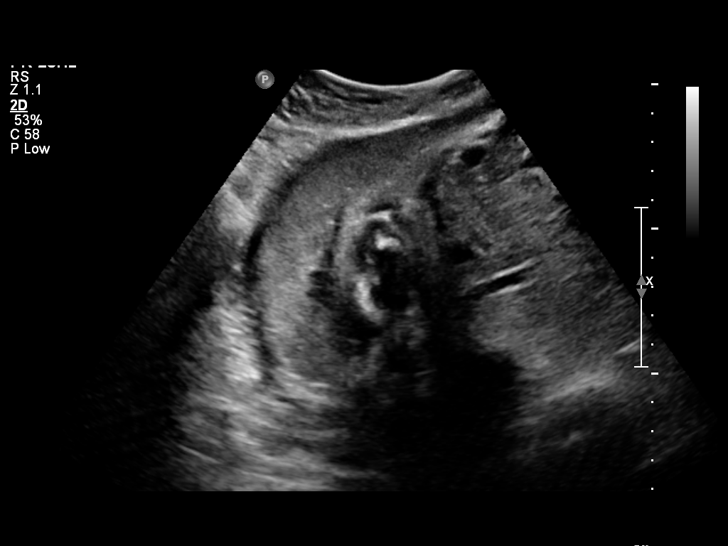
[im 9/46]
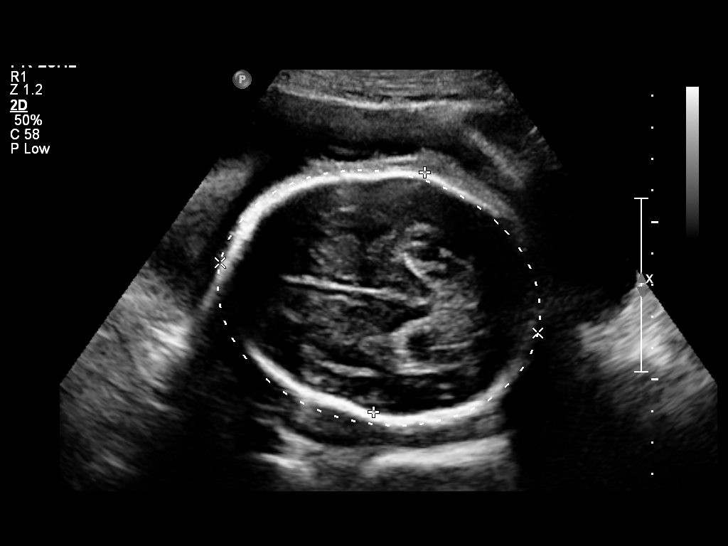
[im 14/46]
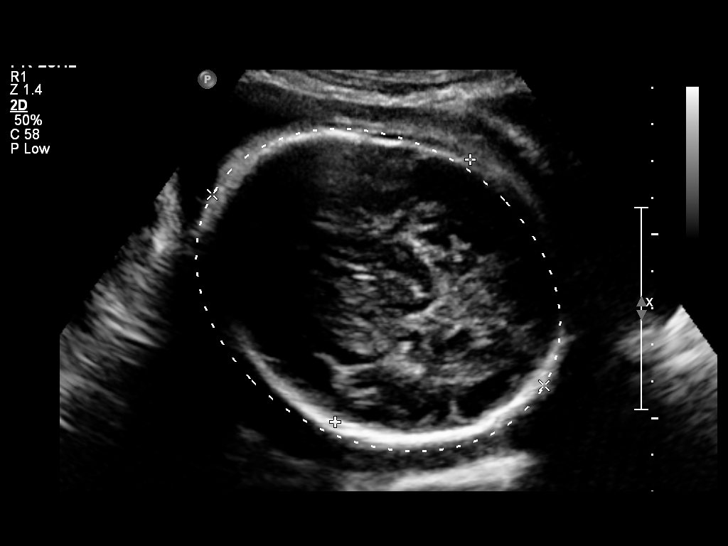
[im 17/46]
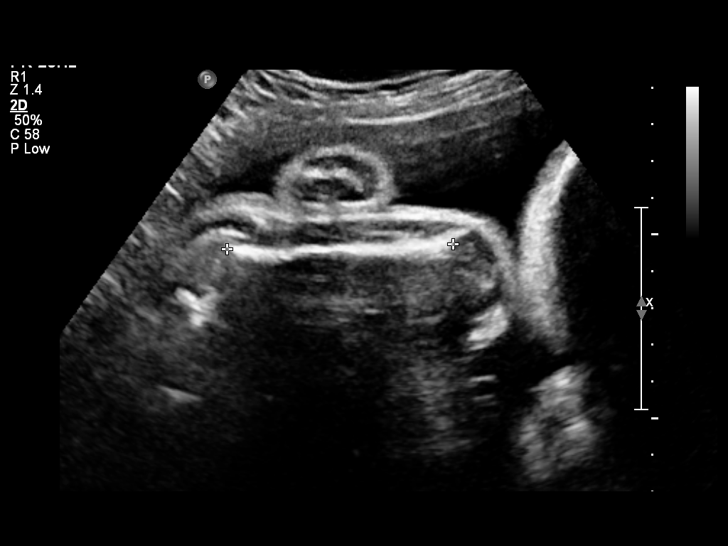
[im 21/46]
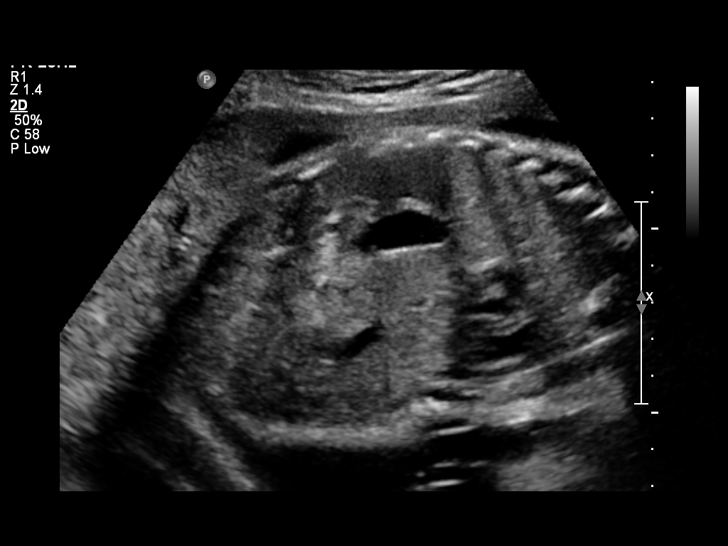
[im 26/46]
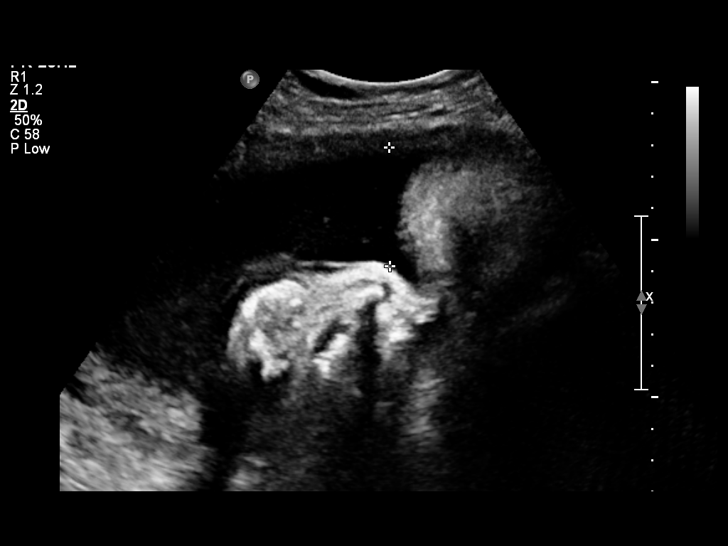
[im 29/46]
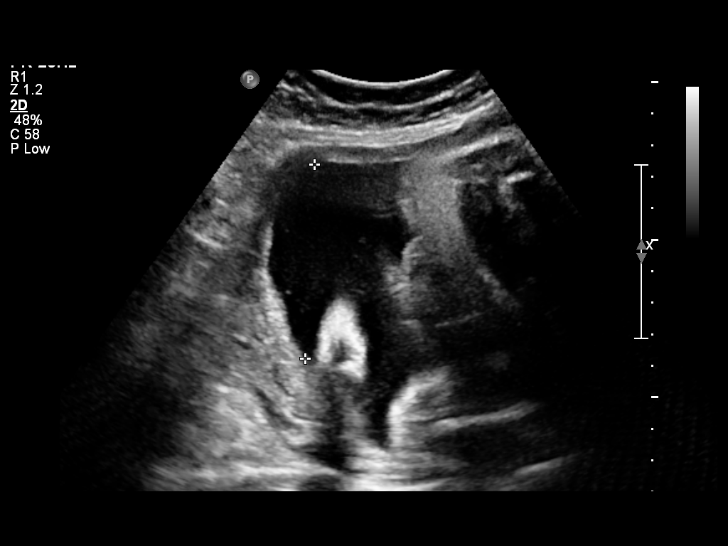
[im 32/46]
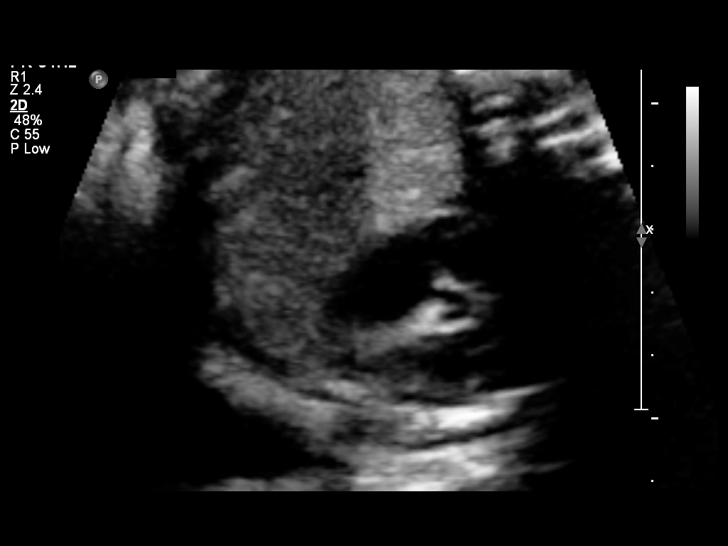
[im 37/46]
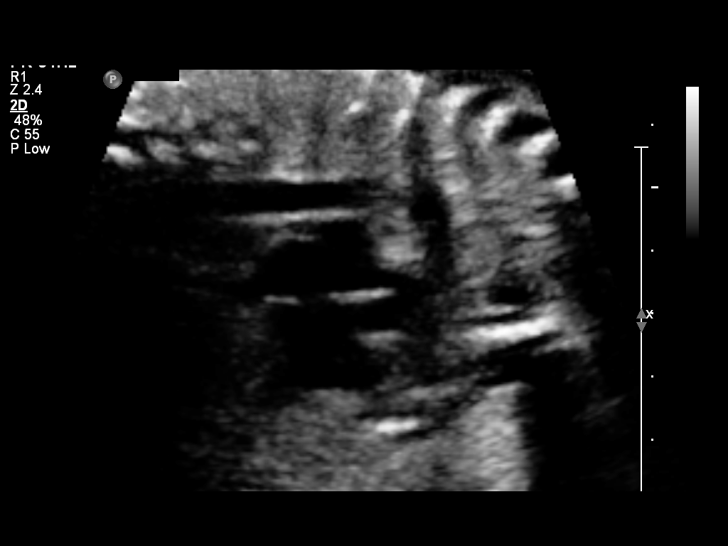
[im 41/46]
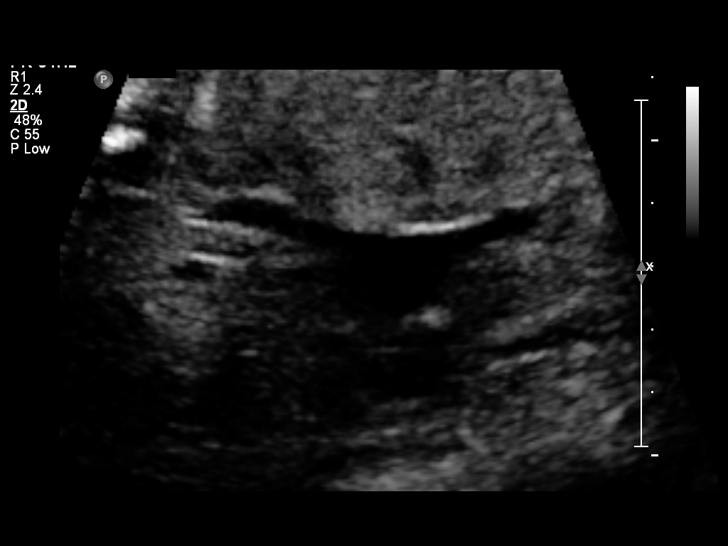
[im 44/46]
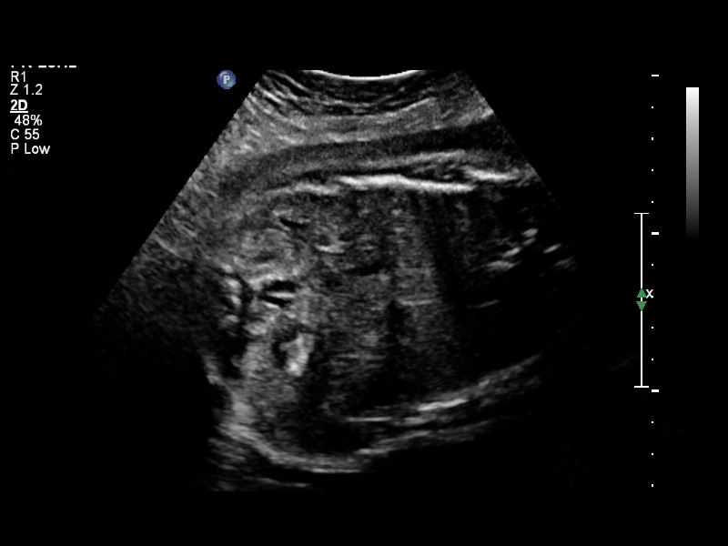

[12 of 28 positions shown; findings below may reference images not displayed]

OBSTETRICS REPORT
                      (Signed Final 09/11/2013 [DATE])

Service(s) Provided

 US OB FOLLOW UP                                       76816.1
Indications

 Pyelectasis of fetus on prenatal ultrasound           655.83 593.89,

 Cystic fibrosis gene carrier (FOB neg carrier)
Fetal Evaluation

 Num Of Fetuses:    1
 Fetal Heart Rate:  150                          bpm
 Cardiac Activity:  Observed
 Presentation:      Cephalic
 Placenta:          Posterior Fundal, above
                    cervical os

 Amniotic Fluid
 AFI FV:      Subjectively within normal limits
 AFI Sum:     17.66   cm       65  %Tile     Larg Pckt:    6.15  cm
 RUQ:   6.15    cm   RLQ:    3.76   cm    LUQ:   5.14    cm   LLQ:    2.61   cm
Biometry

 BPD:     79.7  mm     G. Age:  32w 0d                CI:        74.76   70 - 86
                                                      FL/HC:      21.4   19.9 -

 HC:     292.5  mm     G. Age:  32w 2d      < 3  %    HC/AC:      1.03   0.96 -

 AC:       284  mm     G. Age:  32w 3d       24  %    FL/BPD:     78.4   71 - 87
 FL:      62.5  mm     G. Age:  32w 2d       16  %    FL/AC:      22.0   20 - 24
 HUM:     58.6  mm     G. Age:  33w 6d       70  %
 Est. FW:    0127  gm      4 lb 5 oz     38  %
Gestational Age

 LMP:           33w 3d        Date:  01/20/13                 EDD:   10/27/13
 U/S Today:     32w 2d                                        EDD:   11/04/13
 Best:          33w 3d     Det. By:  LMP  (01/20/13)          EDD:   10/27/13
Anatomy
 Cranium:          Appears normal         Aortic Arch:      Appears normal
 Fetal Cavum:      Appears normal         Ductal Arch:      Appears normal
 Ventricles:       Appears normal         Diaphragm:        Appears normal
 Choroid Plexus:   Previously seen        Stomach:          Appears normal
 Cerebellum:       Previously seen        Abdomen:          Previously seen
 Posterior Fossa:  Previously seen        Abdominal Wall:   Previously seen
 Nuchal Fold:      Previously seen        Cord Vessels:     Appears normal (3
                                                            vessel cord)
 Face:             Orbits and profile     Kidneys:          Appear normal
                   previously seen
 Lips:             Appears normal         Bladder:          Appears normal
 Heart:            Appears normal         Spine:            Previously seen
                   (4CH, axis, and
                   situs)
 RVOT:             Appears normal         Lower             Previously seen
                                          Extremities:
 LVOT:             Appears normal         Upper             Previously seen
                                          Extremities:

 Other:  Male gender.
Impression

 Single IUP at 33 [DATE] weeks
 Follow up ultrasound due to renal pylectasis
 The renal pelvi measuress 5 and 6 mm bilaterally.  This is
 essentially physiologic at this gestation (renal pylecatsis has
 resolved)
 Fetal growth is appropriate (38th %tile)
 Normal amniotic fluid volume
Recommendations

 Follow-up ultrasounds as clinically indicated.

 questions or concerns.

## 2017-12-19 ENCOUNTER — Ambulatory Visit: Payer: Self-pay | Admitting: Family Medicine

## 2018-01-17 ENCOUNTER — Ambulatory Visit (INDEPENDENT_AMBULATORY_CARE_PROVIDER_SITE_OTHER): Payer: Managed Care, Other (non HMO) | Admitting: Family Medicine

## 2018-01-17 ENCOUNTER — Encounter: Payer: Self-pay | Admitting: Family Medicine

## 2018-01-17 VITALS — BP 130/94 | HR 103 | Temp 97.9°F | Ht 63.0 in | Wt 147.1 lb

## 2018-01-17 DIAGNOSIS — I10 Essential (primary) hypertension: Secondary | ICD-10-CM | POA: Diagnosis not present

## 2018-01-17 DIAGNOSIS — Z7689 Persons encountering health services in other specified circumstances: Secondary | ICD-10-CM | POA: Diagnosis not present

## 2018-01-17 MED ORDER — LOSARTAN POTASSIUM 100 MG PO TABS: 100.0000 mg | ORAL_TABLET | Freq: Every day | ORAL | 1 refills | Status: DC

## 2018-01-17 NOTE — Progress Notes (Signed)
BP (!) 130/94   Pulse (!) 103   Temp 97.9 F (36.6 C) (Oral)   Ht 5\' 3"  (1.6 m)   Wt 147 lb 1.6 oz (66.7 kg)   SpO2 99%   BMI 26.06 kg/m    Subjective:    Patient ID: Whitney Rodriguez, female    DOB: [birth year], 32 y.o.   MRN: [id number]  HPI: Whitney Rodriguez is a 32 y.o. female  Chief Complaint  Patient presents with  . Establish Care  . Hypertension    Patient taking Lisinopril 10mg    Pt here today to establish care.   Has dealt with elevated blood pressures for several years now, most recently noted at dentist when going in for a crown. Went to walk in after this and got put on 10 mg lisinopril. Having dry hacking since starting it 2 months ago. BPs still high normal at 130s/high 80s when checked at work. Denies dizziness, HAs, CP, visual changes. Had CBC, CMP, and U/A done at [facility] upon starting medication which were unremarkable per chart review, just showing some mild elevation in RBCs at the time.   Last CPE was about 4 years ago. With a new partner after having been with one person x 17 years.   Relevant past medical, surgical, family and social history reviewed and updated as indicated. Interim medical history since our last visit reviewed. Allergies and medications reviewed and updated.  Review of Systems  Constitutional: Negative.   HENT: Negative.   Respiratory: Negative.   Cardiovascular: Negative.   Gastrointestinal: Negative.   Musculoskeletal: Negative.   Neurological: Negative.   Psychiatric/Behavioral: Negative.    Per HPI unless specifically indicated above     Objective:    BP (!) 130/94   Pulse (!) 103   Temp 97.9 F (36.6 C) (Oral)   Ht 5\' 3"  (1.6 m)   Wt 147 lb 1.6 oz (66.7 kg)   SpO2 99%   BMI 26.06 kg/m   Wt Readings from Last 3 Encounters:  01/17/18 147 lb 1.6 oz (66.7 kg)  11/27/13 164 lb 9.6 oz (74.7 kg)  10/08/13 182 lb (82.6 kg)    Physical Exam  Constitutional: She is oriented to person, place, and time. She appears  well-developed and well-nourished. No distress.  HENT:  Head: Atraumatic.  Eyes: Conjunctivae are normal. Pupils are equal, round, and reactive to light. No scleral icterus.  Neck: Normal range of motion. Neck supple.  Cardiovascular: Normal rate and normal heart sounds.  Pulmonary/Chest: Effort normal and breath sounds normal. No respiratory distress.  Musculoskeletal: Normal range of motion.  Neurological: She is alert and oriented to person, place, and time.  Skin: Skin is warm and dry.  Psychiatric: She has a normal mood and affect. Her behavior is normal.  Nursing note and vitals reviewed.     Assessment & Plan:   Problem List Items Addressed This Visit      Cardiovascular and Mediastinum   Essential hypertension - Primary    Mild improvement with 10 mg lisinopril, but having side effects. Will switch to losartan and increase to 100 mg tabs. Check several times weekly at work and follow up with persistently abnormal readings or any side effects or sxs of hypotension. Will check BMP with CPE in 1 month. Risks and side effects reviewed with pt. DASH diet, increase exercise.       Relevant Medications   losartan (COZAAR) 100 MG tablet    Other Visit Diagnoses    Encounter to establish  care       Will get CPE next month at BP follow up       Follow up plan: Return in about 4 weeks (around 02/14/2018) for CPE, BP f/u.

## 2018-01-17 NOTE — Patient Instructions (Signed)
Follow up in 1 month   

## 2018-01-17 NOTE — Assessment & Plan Note (Signed)
Mild improvement with 10 mg lisinopril, but having side effects. Will switch to losartan and increase to 100 mg tabs. Check several times weekly at work and follow up with persistently abnormal readings or any side effects or sxs of hypotension. Will check BMP with CPE in 1 month. Risks and side effects reviewed with pt. DASH diet, increase exercise.

## 2018-02-16 ENCOUNTER — Ambulatory Visit (INDEPENDENT_AMBULATORY_CARE_PROVIDER_SITE_OTHER): Payer: Managed Care, Other (non HMO) | Admitting: Family Medicine

## 2018-02-16 ENCOUNTER — Encounter: Payer: Self-pay | Admitting: Family Medicine

## 2018-02-16 VITALS — BP 128/90 | HR 74 | Ht 63.0 in | Wt 147.0 lb

## 2018-02-16 DIAGNOSIS — Z113 Encounter for screening for infections with a predominantly sexual mode of transmission: Secondary | ICD-10-CM

## 2018-02-16 DIAGNOSIS — Z0000 Encounter for general adult medical examination without abnormal findings: Secondary | ICD-10-CM

## 2018-02-16 DIAGNOSIS — Z124 Encounter for screening for malignant neoplasm of cervix: Secondary | ICD-10-CM | POA: Diagnosis not present

## 2018-02-16 DIAGNOSIS — I10 Essential (primary) hypertension: Secondary | ICD-10-CM

## 2018-02-16 DIAGNOSIS — Z0001 Encounter for general adult medical examination with abnormal findings: Secondary | ICD-10-CM

## 2018-02-16 LAB — MICROSCOPIC EXAMINATION

## 2018-02-16 LAB — UA/M W/RFLX CULTURE, ROUTINE
BILIRUBIN UA: NEGATIVE
Glucose, UA: NEGATIVE
Ketones, UA: NEGATIVE
Nitrite, UA: NEGATIVE
Protein, UA: NEGATIVE
RBC UA: NEGATIVE
Specific Gravity, UA: 1.01 (ref 1.005–1.030)
Urobilinogen, Ur: 0.2 mg/dL (ref 0.2–1.0)
pH, UA: 7 (ref 5.0–7.5)

## 2018-02-16 NOTE — Progress Notes (Signed)
BP 128/90   Pulse 74   Ht 5\' 3"  (1.6 m)   Wt 147 lb (66.7 kg)   SpO2 98%   BMI 26.04 kg/m    Subjective:    Patient ID: Whitney Rodriguez, female    DOB: August 19, 1986, 32 y.o.   MRN: [id number]  HPI: Whitney Rodriguez is a 32 y.o. female presenting on 02/16/2018 for comprehensive medical examination. Current medical complaints include:see below  Has been taking the losartan sporadically as she feels it sedates her. 130s-140s/70s when she has occasionally checked her readings. Denies CP, SOB, HAs, dizziness. Has been trying to adhere to DASH diet.   Depression Screen done today and results listed below:  [facility] 02/16/2018  Decreased Interest 0  Down, Depressed, Hopeless 0  PHQ - 2 Score 0  Altered sleeping 0  Tired, decreased energy 0  Change in appetite 0  Feeling bad or failure about yourself  0  Trouble concentrating 0  Moving slowly or fidgety/restless 0  Suicidal thoughts 0  PHQ-9 Score 0    The patient does not have a history of falls. I did not complete a risk assessment for falls. A plan of care for falls was not documented.   Past Medical History:  Past Medical History:  Diagnosis Date  . Cystic fibrosis gene carrier    fob is negative for gene  . Hypertension   . Medical history non-contributory     Surgical History:  Past Surgical History:  Procedure Laterality Date  . TUBAL LIGATION Bilateral 10/09/2013   Procedure: POST PARTUM TUBAL LIGATION;  Surgeon: Willodean Rosenthalarolyn Harraway-Smith, MD;  Location: [facility];  Service: Gynecology;  Laterality: Bilateral;    Medications:  Current Outpatient Medications on File Prior to Visit  Medication Sig  . losartan (COZAAR) 100 MG tablet Take 1 tablet (100 mg total) by mouth daily.   No current facility-administered medications on file prior to visit.     Allergies:  Allergies  Allergen Reactions  . Lisinopril Cough    Social History:  Social History   Socioeconomic History  . Marital status: Single      Spouse name: Not on file  . Number of children: Not on file  . Years of education: Not on file  . Highest education level: Not on file  Social Needs  . Financial resource strain: Not on file  . Food insecurity - worry: Not on file  . Food insecurity - inability: Not on file  . Transportation needs - medical: Not on file  . Transportation needs - non-medical: Not on file  Occupational History  . Not on file  Tobacco Use  . Smoking status: Current Every Day Smoker  . Smokeless tobacco: Never Used  Substance and Sexual Activity  . Alcohol use: Yes    Alcohol/week: 1.2 oz    Types: 1 Glasses of wine, 1 Cans of beer per week    Comment: Using electronic cigarette rarely at this point/ pt is social drinker when not pregnant.  . Drug use: No  . Sexual activity: Yes    Partners: Male    Birth control/protection: Surgical  Other Topics Concern  . Not on file  Social History Narrative  . Not on file   Social History   Tobacco Use  Smoking Status Current Every Day Smoker  Smokeless Tobacco Never Used   Social History   Substance and Sexual Activity  Alcohol Use Yes  . Alcohol/week: 1.2 oz  . Types: 1 Glasses of wine,  1 Cans of beer per week   Comment: Using electronic cigarette rarely at this point/ pt is social drinker when not pregnant.    Family History:  Family History  Problem Relation Age of Onset  . Hypertension Father     Past medical history, surgical history, medications, allergies, family history and social history reviewed with patient today and changes made to appropriate areas of the chart.   Review of Systems - General ROS: negative Psychological ROS: negative Ophthalmic ROS: negative ENT ROS: negative Allergy and Immunology ROS: negative Breast ROS: negative for breast lumps Respiratory ROS: no cough, shortness of breath, or wheezing Cardiovascular ROS: no chest pain or dyspnea on exertion Gastrointestinal ROS: no abdominal pain, change in bowel  habits, or black or bloody stools Genito-Urinary ROS: no dysuria, trouble voiding, or hematuria Musculoskeletal ROS: negative Neurological ROS: no TIA or stroke symptoms Dermatological ROS: negative All other ROS negative except what is listed above and in the HPI.      Objective:    BP 128/90   Pulse 74   Ht 5\' 3"  (1.6 m)   Wt 147 lb (66.7 kg)   SpO2 98%   BMI 26.04 kg/m   Wt Readings from Last 3 Encounters:  02/16/18 147 lb (66.7 kg)  01/17/18 147 lb 1.6 oz (66.7 kg)  11/27/13 164 lb 9.6 oz (74.7 kg)    Physical Exam  Constitutional: She is oriented to person, place, and time. She appears well-developed and well-nourished. No distress.  HENT:  Head: Atraumatic.  Right Ear: External ear normal.  Left Ear: External ear normal.  Nose: Nose normal.  Mouth/Throat: Oropharynx is clear and moist. No oropharyngeal exudate.  Eyes: Conjunctivae are normal. Pupils are equal, round, and reactive to light. No scleral icterus.  Neck: Normal range of motion. Neck supple. No thyromegaly present.  Cardiovascular: Normal rate, regular rhythm, normal heart sounds and intact distal pulses.  Pulmonary/Chest: Effort normal and breath sounds normal. No respiratory distress. Right breast exhibits no mass, no skin change and no tenderness. Left breast exhibits no mass, no skin change and no tenderness.  Abdominal: Soft. Bowel sounds are normal. She exhibits no mass. There is no tenderness.  Musculoskeletal: Normal range of motion. She exhibits no edema or tenderness.  Lymphadenopathy:    She has no cervical adenopathy.    She has no axillary adenopathy.  Neurological: She is alert and oriented to person, place, and time. No cranial nerve deficit.  Skin: Skin is warm and dry. No rash noted.  Psychiatric: She has a normal mood and affect. Her behavior is normal.  Nursing note and vitals reviewed.   Results for orders placed or performed in visit on 02/16/18  Microscopic Examination  Result  Value Ref Range   WBC, UA 0-5 0 - 5 /hpf   RBC, UA 0-2 0 - 2 /hpf   Epithelial Cells (non renal) 0-10 0 - 10 /hpf   Bacteria, UA Few None seen/Few  CBC with Differential/Platelet  Result Value Ref Range   WBC 6.2 3.4 - 10.8 x10E3/uL   RBC 5.45 (H) 3.77 - 5.28 x10E6/uL   Hemoglobin 18.4 (H) 11.1 - 15.9 g/dL   Hematocrit 16.1 (H) 09.6 - 46.6 %   MCV 99 (H) 79 - 97 fL   MCH 33.8 (H) 26.6 - 33.0 pg   MCHC 34.2 31.5 - 35.7 g/dL   RDW 04.5 40.9 - 81.1 %   Platelets 244 150 - 379 x10E3/uL   Neutrophils 70 Not Estab. %  Lymphs 20 Not Estab. %   Monocytes 7 Not Estab. %   Eos 2 Not Estab. %   Basos 1 Not Estab. %   Neutrophils Absolute 4.4 1.4 - 7.0 x10E3/uL   Lymphocytes Absolute 1.2 0.7 - 3.1 x10E3/uL   Monocytes Absolute 0.5 0.1 - 0.9 x10E3/uL   EOS (ABSOLUTE) 0.1 0.0 - 0.4 x10E3/uL   Basophils Absolute 0.0 0.0 - 0.2 x10E3/uL   Immature Granulocytes 0 Not Estab. %   Immature Grans (Abs) 0.0 0.0 - 0.1 x10E3/uL  Comprehensive metabolic panel  Result Value Ref Range   Glucose 68 65 - 99 mg/dL   BUN 7 6 - 20 mg/dL   Creatinine, Ser 2.13 0.57 - 1.00 mg/dL   GFR calc non Af Amer 107 >59 mL/min/1.73   GFR calc Af Amer 123 >59 mL/min/1.73   BUN/Creatinine Ratio 9 9 - 23   Sodium 141 134 - 144 mmol/L   Potassium 4.0 3.5 - 5.2 mmol/L   Chloride 101 96 - 106 mmol/L   CO2 23 20 - 29 mmol/L   Calcium 9.3 8.7 - 10.2 mg/dL   Total Protein 7.1 6.0 - 8.5 g/dL   Albumin 4.3 3.5 - 5.5 g/dL   Globulin, Total 2.8 1.5 - 4.5 g/dL   Albumin/Globulin Ratio 1.5 1.2 - 2.2   Bilirubin Total 0.8 0.0 - 1.2 mg/dL   Alkaline Phosphatase 78 39 - 117 IU/L   AST 20 0 - 40 IU/L   ALT 14 0 - 32 IU/L  Lipid Panel w/o Chol/HDL Ratio  Result Value Ref Range   Cholesterol, Total 138 100 - 199 mg/dL   Triglycerides 65 0 - 149 mg/dL   HDL 63 >08 mg/dL   VLDL Cholesterol Cal 13 5 - 40 mg/dL   LDL Calculated 62 0 - 99 mg/dL  TSH  Result Value Ref Range   TSH 0.537 0.450 - 4.500 uIU/mL  UA/M w/rflx Culture,  Routine  Result Value Ref Range   Specific Gravity, UA 1.010 1.005 - 1.030   pH, UA 7.0 5.0 - 7.5   Color, UA Yellow Yellow   Appearance Ur Cloudy (A) Clear   Leukocytes, UA Trace (A) Negative   Protein, UA Negative Negative/Trace   Glucose, UA Negative Negative   Ketones, UA Negative Negative   RBC, UA Negative Negative   Bilirubin, UA Negative Negative   Urobilinogen, Ur 0.2 0.2 - 1.0 mg/dL   Nitrite, UA Negative Negative   Microscopic Examination See below:   HIV antibody  Result Value Ref Range   HIV Screen 4th Generation wRfx Non Reactive Non Reactive  RPR  Result Value Ref Range   RPR Ser Ql Non Reactive Non Reactive  HSV(herpes simplex vrs) 1+2 ab-IgG  Result Value Ref Range   HSV 1 Glycoprotein G Ab, IgG 2.00 (H) 0.00 - 0.90 index   HSV 2 IgG, Type Spec <0.91 0.00 - 0.90 index      Assessment & Plan:   Problem List Items Addressed This Visit      Cardiovascular and Mediastinum   Essential hypertension - Primary    Pt to try night dosing with her losartan, regularly check BPs and keep a log. If no improvement in side effects, can try half dose or switching medications.       Relevant Orders   CBC with Differential/Platelet (Completed)   Comprehensive metabolic panel (Completed)   UA/M w/rflx Culture, Routine (Completed)    Other Visit Diagnoses    Annual physical exam  Relevant Orders   Lipid Panel w/o Chol/HDL Ratio (Completed)   TSH (Completed)   Routine screening for STI (sexually transmitted infection)       Per pt request annually   Relevant Orders   HIV antibody (Completed)   RPR (Completed)   HSV(herpes simplex vrs) 1+2 ab-IgG (Completed)   GC/Chlamydia Probe Amp   Screening for cervical cancer       Relevant Orders   IGP, Aptima HPV, rfx 16/18,45       Follow up plan: Return in about 3 months (around 05/16/2018) for BP check.   LABORATORY TESTING:  - Pap smear: pap done  IMMUNIZATIONS:   - Tdap: Tetanus vaccination status reviewed:  last tetanus booster within 10 years. - Influenza: Up to date  PATIENT COUNSELING:   Advised to take 1 mg of folate supplement per day if capable of pregnancy.   Sexuality: Discussed sexually transmitted diseases, partner selection, use of condoms, avoidance of unintended pregnancy  and contraceptive alternatives.   Advised to avoid cigarette smoking.  I discussed with the patient that most people either abstain from alcohol or drink within safe limits (<=14/week and <=4 drinks/occasion for males, <=7/weeks and <= 3 drinks/occasion for females) and that the risk for alcohol disorders and other health effects rises proportionally with the number of drinks per week and how often a drinker exceeds daily limits.  Discussed cessation/primary prevention of drug use and availability of treatment for abuse.   Diet: Encouraged to adjust caloric intake to maintain  or achieve ideal body weight, to reduce intake of dietary saturated fat and total fat, to limit sodium intake by avoiding high sodium foods and not adding table salt, and to maintain adequate dietary potassium and calcium preferably from fresh fruits, vegetables, and low-fat dairy products.    stressed the importance of regular exercise  Injury prevention: Discussed safety belts, safety helmets, smoke detector, smoking near bedding or upholstery.   Dental health: Discussed importance of regular tooth brushing, flossing, and dental visits.    NEXT PREVENTATIVE PHYSICAL DUE IN 1 YEAR. Return in about 3 months (around 05/16/2018) for BP check.

## 2018-02-17 LAB — LIPID PANEL W/O CHOL/HDL RATIO
CHOLESTEROL TOTAL: 138 mg/dL (ref 100–199)
HDL: 63 mg/dL (ref >39)
LDL Calculated: 62 mg/dL (ref 0–99)
TRIGLYCERIDES: 65 mg/dL (ref 0–149)
VLDL Cholesterol Cal: 13 mg/dL (ref 5–40)

## 2018-02-17 LAB — HIV ANTIBODY (ROUTINE TESTING W REFLEX): HIV Screen 4th Generation wRfx: NONREACTIVE

## 2018-02-17 LAB — COMPREHENSIVE METABOLIC PANEL
ALK PHOS: 78 IU/L (ref 39–117)
ALT: 14 IU/L (ref 0–32)
AST: 20 IU/L (ref 0–40)
Albumin/Globulin Ratio: 1.5 (ref 1.2–2.2)
Albumin: 4.3 g/dL (ref 3.5–5.5)
BUN/Creatinine Ratio: 9 (ref 9–23)
BUN: 7 mg/dL (ref 6–20)
Bilirubin Total: 0.8 mg/dL (ref 0.0–1.2)
CO2: 23 mmol/L (ref 20–29)
CREATININE: 0.74 mg/dL (ref 0.57–1.00)
Calcium: 9.3 mg/dL (ref 8.7–10.2)
Chloride: 101 mmol/L (ref 96–106)
GFR calc Af Amer: 123 mL/min/{1.73_m2} (ref >59)
GFR calc non Af Amer: 107 mL/min/{1.73_m2} (ref >59)
Globulin, Total: 2.8 g/dL (ref 1.5–4.5)
Glucose: 68 mg/dL (ref 65–99)
POTASSIUM: 4 mmol/L (ref 3.5–5.2)
Sodium: 141 mmol/L (ref 134–144)
Total Protein: 7.1 g/dL (ref 6.0–8.5)

## 2018-02-17 LAB — CBC WITH DIFFERENTIAL/PLATELET
BASOS: 1 %
Basophils Absolute: 0 10*3/uL (ref 0.0–0.2)
EOS (ABSOLUTE): 0.1 10*3/uL (ref 0.0–0.4)
Eos: 2 %
Hematocrit: 53.8 % — ABNORMAL HIGH (ref 34.0–46.6)
Hemoglobin: 18.4 g/dL — ABNORMAL HIGH (ref 11.1–15.9)
IMMATURE GRANS (ABS): 0 10*3/uL (ref 0.0–0.1)
IMMATURE GRANULOCYTES: 0 %
LYMPHS: 20 %
Lymphocytes Absolute: 1.2 10*3/uL (ref 0.7–3.1)
MCH: 33.8 pg — AB (ref 26.6–33.0)
MCHC: 34.2 g/dL (ref 31.5–35.7)
MCV: 99 fL — AB (ref 79–97)
Monocytes Absolute: 0.5 10*3/uL (ref 0.1–0.9)
Monocytes: 7 %
NEUTROS ABS: 4.4 10*3/uL (ref 1.4–7.0)
Neutrophils: 70 %
Platelets: 244 10*3/uL (ref 150–379)
RBC: 5.45 x10E6/uL — ABNORMAL HIGH (ref 3.77–5.28)
RDW: 13.7 % (ref 12.3–15.4)
WBC: 6.2 10*3/uL (ref 3.4–10.8)

## 2018-02-17 LAB — HSV(HERPES SIMPLEX VRS) I + II AB-IGG: HSV 1 Glycoprotein G Ab, IgG: 2 index — ABNORMAL HIGH (ref 0.00–0.90)

## 2018-02-17 LAB — RPR: RPR Ser Ql: NONREACTIVE

## 2018-02-17 LAB — TSH: TSH: 0.537 u[IU]/mL (ref 0.450–4.500)

## 2018-02-18 LAB — GC/CHLAMYDIA PROBE AMP
Chlamydia trachomatis, NAA: NEGATIVE
Neisseria gonorrhoeae by PCR: NEGATIVE

## 2018-02-18 NOTE — Assessment & Plan Note (Signed)
Pt to try night dosing with her losartan, regularly check BPs and keep a log. If no improvement in side effects, can try half dose or switching medications.

## 2018-02-18 NOTE — Patient Instructions (Signed)
Follow-up in 3 months for blood pressure check.

## 2018-02-20 LAB — IGP, APTIMA HPV, RFX 16/18,45
HPV Aptima: NEGATIVE
PAP SMEAR COMMENT: 0

## 2018-02-21 ENCOUNTER — Telehealth: Payer: Self-pay | Admitting: Family Medicine

## 2018-02-21 NOTE — Telephone Encounter (Signed)
CRM was created for results and routed to Nurse Triage. Closing CRM.  But I have contacted patient and given her results.

## 2018-02-21 NOTE — Telephone Encounter (Signed)
Tried returning call, pt did not answer. Will try again later.   Copied from CRM [phone]. Topic: Quick Communication - Office Called Patient >> Feb 20, 2018  2:33 PM Guinevere FerrariMorris, Sharamare E, NT wrote: Reason for CRM: Patient called back but no CRM noted.  >> Feb 21, 2018  9:19 AM Landis Martinsurtis, Karen E wrote: Please see unsure if someone has tried to reach the patient.  Thanks

## 2018-03-30 ENCOUNTER — Ambulatory Visit: Payer: Managed Care, Other (non HMO) | Admitting: Family Medicine

## 2018-03-30 ENCOUNTER — Encounter: Payer: Self-pay | Admitting: Family Medicine

## 2018-03-30 ENCOUNTER — Other Ambulatory Visit: Payer: Self-pay

## 2018-03-30 VITALS — BP 152/107 | HR 91 | Temp 98.6°F | Wt 149.2 lb

## 2018-03-30 DIAGNOSIS — R590 Localized enlarged lymph nodes: Secondary | ICD-10-CM

## 2018-03-30 NOTE — Progress Notes (Signed)
BP (!) 152/107 (BP Location: Right Arm, Patient Position: Sitting, Cuff Size: Normal)   Pulse 91   Temp 98.6 F (37 C) (Oral)   Wt 149 lb 3 oz (67.7 kg)   SpO2 100%   BMI 26.43 kg/m    Subjective:    Patient ID: Whitney Rodriguez, female    DOB: 1986-04-09, 32 y.o.   MRN: [id number]  HPI: Whitney Rodriguez is a 32 y.o. female  Chief Complaint  Patient presents with  . Neck swelling    left side, began on Monday.  Sore to the touch and patient has much discomfort when eating.    Several days of mild left neck swelling and soreness below earlobe at point of mandible. No allergy sxs, fevers, fatigue, sore throat, dysphagia. Has not been trying anything OTC. No sick contacts. Current every day smoker, no hx of seasonal allergies.   Relevant past medical, surgical, family and social history reviewed and updated as indicated. Interim medical history since our last visit reviewed. Allergies and medications reviewed and updated.  Review of Systems  Per HPI unless specifically indicated above     Objective:    BP (!) 152/107 (BP Location: Right Arm, Patient Position: Sitting, Cuff Size: Normal)   Pulse 91   Temp 98.6 F (37 C) (Oral)   Wt 149 lb 3 oz (67.7 kg)   SpO2 100%   BMI 26.43 kg/m   Wt Readings from Last 3 Encounters:  03/30/18 149 lb 3 oz (67.7 kg)  02/16/18 147 lb (66.7 kg)  01/17/18 147 lb 1.6 oz (66.7 kg)    Physical Exam  Constitutional: She is oriented to person, place, and time. She appears well-developed and well-nourished.  HENT:  Head: Atraumatic.  Right Ear: External ear normal.  Left Ear: External ear normal.  Nose: Nose normal.  Mouth/Throat: Oropharynx is clear and moist. No oropharyngeal exudate.  Eyes: Pupils are equal, round, and reactive to light.  Neck: Normal range of motion. Neck supple. No tracheal deviation present. No thyromegaly present.  Cardiovascular: Normal rate and regular rhythm.  Pulmonary/Chest: Effort normal and breath sounds  normal. No stridor.  Musculoskeletal: Normal range of motion.  Lymphadenopathy:    She has cervical adenopathy (minimally ttp).  Neurological: She is alert and oriented to person, place, and time.  Skin: Skin is warm and dry.  Psychiatric: She has a normal mood and affect. Her behavior is normal.  Nursing note and vitals reviewed.  Results for orders placed or performed in visit on 02/16/18  GC/Chlamydia Probe Amp  Result Value Ref Range   Chlamydia trachomatis, NAA Negative Negative   Neisseria gonorrhoeae by PCR Negative Negative  Microscopic Examination  Result Value Ref Range   WBC, UA 0-5 0 - 5 /hpf   RBC, UA 0-2 0 - 2 /hpf   Epithelial Cells (non renal) 0-10 0 - 10 /hpf   Bacteria, UA Few None seen/Few  CBC with Differential/Platelet  Result Value Ref Range   WBC 6.2 3.4 - 10.8 x10E3/uL   RBC 5.45 (H) 3.77 - 5.28 x10E6/uL   Hemoglobin 18.4 (H) 11.1 - 15.9 g/dL   Hematocrit 08.6 (H) 57.8 - 46.6 %   MCV 99 (H) 79 - 97 fL   MCH 33.8 (H) 26.6 - 33.0 pg   MCHC 34.2 31.5 - 35.7 g/dL   RDW 46.9 62.9 - 52.8 %   Platelets 244 150 - 379 x10E3/uL   Neutrophils 70 Not Estab. %   Lymphs 20 Not Estab. %  Monocytes 7 Not Estab. %   Eos 2 Not Estab. %   Basos 1 Not Estab. %   Neutrophils Absolute 4.4 1.4 - 7.0 x10E3/uL   Lymphocytes Absolute 1.2 0.7 - 3.1 x10E3/uL   Monocytes Absolute 0.5 0.1 - 0.9 x10E3/uL   EOS (ABSOLUTE) 0.1 0.0 - 0.4 x10E3/uL   Basophils Absolute 0.0 0.0 - 0.2 x10E3/uL   Immature Granulocytes 0 Not Estab. %   Immature Grans (Abs) 0.0 0.0 - 0.1 x10E3/uL  Comprehensive metabolic panel  Result Value Ref Range   Glucose 68 65 - 99 mg/dL   BUN 7 6 - 20 mg/dL   Creatinine, Ser 7.820.74 0.57 - 1.00 mg/dL   GFR calc non Af Amer 107 >59 mL/min/1.73   GFR calc Af Amer 123 >59 mL/min/1.73   BUN/Creatinine Ratio 9 9 - 23   Sodium 141 134 - 144 mmol/L   Potassium 4.0 3.5 - 5.2 mmol/L   Chloride 101 96 - 106 mmol/L   CO2 23 20 - 29 mmol/L   Calcium 9.3 8.7 - 10.2  mg/dL   Total Protein 7.1 6.0 - 8.5 g/dL   Albumin 4.3 3.5 - 5.5 g/dL   Globulin, Total 2.8 1.5 - 4.5 g/dL   Albumin/Globulin Ratio 1.5 1.2 - 2.2   Bilirubin Total 0.8 0.0 - 1.2 mg/dL   Alkaline Phosphatase 78 39 - 117 IU/L   AST 20 0 - 40 IU/L   ALT 14 0 - 32 IU/L  Lipid Panel w/o Chol/HDL Ratio  Result Value Ref Range   Cholesterol, Total 138 100 - 199 mg/dL   Triglycerides 65 0 - 149 mg/dL   HDL 63 >95>39 mg/dL   VLDL Cholesterol Cal 13 5 - 40 mg/dL   LDL Calculated 62 0 - 99 mg/dL  TSH  Result Value Ref Range   TSH 0.537 0.450 - 4.500 uIU/mL  UA/M w/rflx Culture, Routine  Result Value Ref Range   Specific Gravity, UA 1.010 1.005 - 1.030   pH, UA 7.0 5.0 - 7.5   Color, UA Yellow Yellow   Appearance Ur Cloudy (A) Clear   Leukocytes, UA Trace (A) Negative   Protein, UA Negative Negative/Trace   Glucose, UA Negative Negative   Ketones, UA Negative Negative   RBC, UA Negative Negative   Bilirubin, UA Negative Negative   Urobilinogen, Ur 0.2 0.2 - 1.0 mg/dL   Nitrite, UA Negative Negative   Microscopic Examination See below:   HIV antibody  Result Value Ref Range   HIV Screen 4th Generation wRfx Non Reactive Non Reactive  RPR  Result Value Ref Range   RPR Ser Ql Non Reactive Non Reactive  HSV(herpes simplex vrs) 1+2 ab-IgG  Result Value Ref Range   HSV 1 Glycoprotein G Ab, IgG 2.00 (H) 0.00 - 0.90 index   HSV 2 IgG, Type Spec <0.91 0.00 - 0.90 index  IGP, Aptima HPV, rfx 16/18,45  Result Value Ref Range   DIAGNOSIS: Comment (A)    Recommendation: Comment (A)    Specimen adequacy: Comment    Clinician Provided ICD10 Comment    Performed by: Comment    Electronically signed by: Comment    PAP Smear Comment .    PATHOLOGIST PROVIDED ICD10: Comment    Note: Comment    Test Methodology Comment    HPV Aptima Negative Negative      Assessment & Plan:   Problem List Items Addressed This Visit    None    Visit Diagnoses    Cervical  lymphadenopathy    -  Primary    Check CBC today, no overt signs or sxs of infection other than focal lymphadenopathy. Warm compresses, OTC pain relievers, f/u if no improvement in 1-2 weeks   Relevant Orders   CBC with Differential/Platelet       Follow up plan: Return for as scheduled.

## 2018-03-30 NOTE — Patient Instructions (Signed)
Follow up as needed

## 2018-03-31 LAB — CBC WITH DIFFERENTIAL/PLATELET
BASOS: 0 %
Basophils Absolute: 0 10*3/uL (ref 0.0–0.2)
EOS (ABSOLUTE): 0.1 10*3/uL (ref 0.0–0.4)
Eos: 2 %
HEMOGLOBIN: 17.2 g/dL — AB (ref 11.1–15.9)
Hematocrit: 49.5 % — ABNORMAL HIGH (ref 34.0–46.6)
IMMATURE GRANS (ABS): 0 10*3/uL (ref 0.0–0.1)
IMMATURE GRANULOCYTES: 0 %
LYMPHS: 23 %
Lymphocytes Absolute: 1.5 10*3/uL (ref 0.7–3.1)
MCH: 32.8 pg (ref 26.6–33.0)
MCHC: 34.7 g/dL (ref 31.5–35.7)
MCV: 95 fL (ref 79–97)
MONOCYTES: 11 %
Monocytes Absolute: 0.7 10*3/uL (ref 0.1–0.9)
NEUTROS ABS: 4.2 10*3/uL (ref 1.4–7.0)
Neutrophils: 64 %
Platelets: 230 10*3/uL (ref 150–379)
RBC: 5.24 x10E6/uL (ref 3.77–5.28)
RDW: 13.4 % (ref 12.3–15.4)
WBC: 6.6 10*3/uL (ref 3.4–10.8)

## 2018-05-19 ENCOUNTER — Ambulatory Visit: Payer: Managed Care, Other (non HMO) | Admitting: Family Medicine

## 2018-10-19 ENCOUNTER — Ambulatory Visit: Payer: Managed Care, Other (non HMO) | Admitting: Family Medicine

## 2018-10-19 ENCOUNTER — Encounter: Payer: Self-pay | Admitting: Family Medicine

## 2018-10-19 VITALS — BP 152/101 | HR 107 | Temp 98.6°F | Ht 63.0 in | Wt 155.3 lb

## 2018-10-19 DIAGNOSIS — H66002 Acute suppurative otitis media without spontaneous rupture of ear drum, left ear: Secondary | ICD-10-CM | POA: Diagnosis not present

## 2018-10-19 DIAGNOSIS — J029 Acute pharyngitis, unspecified: Secondary | ICD-10-CM

## 2018-10-19 MED ORDER — FLUCONAZOLE 150 MG PO TABS: 150.0000 mg | ORAL_TABLET | Freq: Once | ORAL | 0 refills | Status: AC

## 2018-10-19 MED ORDER — AMOXICILLIN-POT CLAVULANATE 875-125 MG PO TABS: 1.0000 | ORAL_TABLET | Freq: Two times a day (BID) | ORAL | 0 refills | Status: DC

## 2018-10-19 NOTE — Progress Notes (Signed)
BP (!) 152/101 (BP Location: Left Arm, Patient Position: Sitting, Cuff Size: Normal)   Pulse (!) 107   Temp 98.6 F (37 C) (Oral)   Ht 5\' 3"  (1.6 m)   Wt 155 lb 4.8 oz (70.4 kg)   SpO2 100%   BMI 27.51 kg/m    Subjective:    Patient ID: Whitney Rodriguez, female    DOB: [birth year], 32 y.o.   MRN: [id number]  HPI: Whitney Rodriguez is a 32 y.o. female  Chief Complaint  Patient presents with  . Sore Throat    Began yesterday   UPPER RESPIRATORY TRACT INFECTION Duration: 1 day Worst symptom: sore throat Fever: no Cough: no Shortness of breath: no Wheezing: no Chest pain: no Chest tightness: no Chest congestion: no Nasal congestion: no Runny nose: no Post nasal drip: yes Sneezing: no Sore throat: yes Swollen glands: yes Sinus pressure: no Headache: no Face pain: no Toothache: no Ear pain: no  Ear pressure: yes bilateral Eyes red/itching:no Eye drainage/crusting: no  Vomiting: no Rash: no Fatigue: yes Sick contacts: yes Strep contacts: no  Context: worse Recurrent sinusitis: no Relief with OTC cold/cough medications: no  Treatments attempted: mucinex    Relevant past medical, surgical, family and social history reviewed and updated as indicated. Interim medical history since our last visit reviewed. Allergies and medications reviewed and updated.  Review of Systems  Constitutional: Positive for fatigue. Negative for activity change, appetite change, chills, diaphoresis, fever and unexpected weight change.  HENT: Positive for ear pain, postnasal drip, sore throat, trouble swallowing and voice change. Negative for congestion, dental problem, drooling, ear discharge, facial swelling, hearing loss, mouth sores, nosebleeds, rhinorrhea, sinus pressure, sinus pain, sneezing and tinnitus.   Eyes: Negative.   Respiratory: Negative.   Cardiovascular: Negative.   Gastrointestinal: Negative.   Psychiatric/Behavioral: Negative.     Per HPI unless specifically indicated  above     Objective:    BP (!) 152/101 (BP Location: Left Arm, Patient Position: Sitting, Cuff Size: Normal)   Pulse (!) 107   Temp 98.6 F (37 C) (Oral)   Ht 5\' 3"  (1.6 m)   Wt 155 lb 4.8 oz (70.4 kg)   SpO2 100%   BMI 27.51 kg/m   Wt Readings from Last 3 Encounters:  10/19/18 155 lb 4.8 oz (70.4 kg)  03/30/18 149 lb 3 oz (67.7 kg)  02/16/18 147 lb (66.7 kg)    Physical Exam  Constitutional: She is oriented to person, place, and time. She appears well-developed and well-nourished. No distress.  HENT:  Head: Normocephalic and atraumatic.  Right Ear: Hearing, tympanic membrane and ear canal normal. No drainage, swelling or tenderness. No middle ear effusion.  Left Ear: Hearing and ear canal normal. A middle ear effusion is present.  Nose: Nose normal.  Mouth/Throat: Uvula is midline. No oral lesions. No uvula swelling. Posterior oropharyngeal edema and posterior oropharyngeal erythema present. No oropharyngeal exudate or tonsillar abscesses.  Eyes: Pupils are equal, round, and reactive to light. Conjunctivae, EOM and lids are normal. Right eye exhibits no discharge. Left eye exhibits no discharge. No scleral icterus.  Neck: Normal range of motion. Neck supple. No thyromegaly present.  Cardiovascular: Normal rate, regular rhythm, normal heart sounds and intact distal pulses. Exam reveals no gallop and no friction rub.  No murmur heard. Pulmonary/Chest: Effort normal and breath sounds normal. No stridor. No respiratory distress. She has no wheezes. She has no rhonchi. She has no rales. She exhibits no tenderness.  Musculoskeletal: Normal range  of motion.  Lymphadenopathy:    She has cervical adenopathy.  Neurological: She is alert and oriented to person, place, and time.  Skin: Skin is warm, dry and intact. Capillary refill takes less than 2 seconds. No rash noted. She is not diaphoretic. No erythema. No pallor.  Psychiatric: She has a normal mood and affect. Her speech is normal  and behavior is normal. Judgment and thought content normal. Cognition and memory are normal.  Nursing note and vitals reviewed.   Results for orders placed or performed in visit on 03/30/18  CBC with Differential/Platelet  Result Value Ref Range   WBC 6.6 3.4 - 10.8 x10E3/uL   RBC 5.24 3.77 - 5.28 x10E6/uL   Hemoglobin 17.2 (H) 11.1 - 15.9 g/dL   Hematocrit 16.1 (H) 09.6 - 46.6 %   MCV 95 79 - 97 fL   MCH 32.8 26.6 - 33.0 pg   MCHC 34.7 31.5 - 35.7 g/dL   RDW 04.5 40.9 - 81.1 %   Platelets 230 150 - 379 x10E3/uL   Neutrophils 64 Not Estab. %   Lymphs 23 Not Estab. %   Monocytes 11 Not Estab. %   Eos 2 Not Estab. %   Basos 0 Not Estab. %   Neutrophils Absolute 4.2 1.4 - 7.0 x10E3/uL   Lymphocytes Absolute 1.5 0.7 - 3.1 x10E3/uL   Monocytes Absolute 0.7 0.1 - 0.9 x10E3/uL   EOS (ABSOLUTE) 0.1 0.0 - 0.4 x10E3/uL   Basophils Absolute 0.0 0.0 - 0.2 x10E3/uL   Immature Granulocytes 0 Not Estab. %   Immature Grans (Abs) 0.0 0.0 - 0.1 x10E3/uL      Assessment & Plan:   Problem List Items Addressed This Visit    None    Visit Diagnoses    Non-recurrent acute suppurative otitis media of left ear without spontaneous rupture of tympanic membrane    -  Primary   Will treat with augmentin. Diflucan for after abx. Call with any concerns. Continue to monitor.    Relevant Medications   amoxicillin-clavulanate (AUGMENTIN) 875-125 MG tablet   fluconazole (DIFLUCAN) 150 MG tablet   Sore throat       Strep negative. Call with any concerns.    Relevant Orders   Rapid Strep Screen ([facility] ONLY)       Follow up plan: Return if symptoms worsen or fail to improve.

## 2018-10-23 LAB — RAPID STREP SCREEN (MED CTR MEBANE ONLY): STREP GP A AG, IA W/REFLEX: NEGATIVE

## 2018-10-23 LAB — CULTURE, GROUP A STREP

## 2018-12-23 ENCOUNTER — Other Ambulatory Visit: Payer: Self-pay | Admitting: Family Medicine

## 2018-12-25 NOTE — Telephone Encounter (Signed)
Requested Prescriptions  Pending Prescriptions Disp Refills  . losartan (COZAAR) 100 MG tablet [Pharmacy Med Name: LOSARTAN POTASSIUM 100 MG TAB] 90 tablet 1    Sig: TAKE 1 TABLET BY MOUTH EVERY DAY     Cardiovascular:  Angiotensin Receptor Blockers Failed - 12/23/2018  9:26 AM      Failed - Cr in normal range and within 180 days    Creatinine, Ser  Date Value Ref Range Status  02/16/2018 0.74 0.57 - 1.00 mg/dL Final         Failed - K in normal range and within 180 days    Potassium  Date Value Ref Range Status  02/16/2018 4.0 3.5 - 5.2 mmol/L Final         Failed - Last BP in normal range    BP Readings from Last 1 Encounters:  10/19/18 (!) 152/101         Passed - Patient is not pregnant      Passed - Valid encounter within last 6 months    Recent Outpatient Visits          2 months ago Non-recurrent acute suppurative otitis media of left ear without spontaneous rupture of tympanic membrane   [facility] Wade, [place], DO   9 months ago Cervical lymphadenopathy   [facility] Particia Nearing, [place]   10 months ago Essential hypertension   [facility] Roosvelt Maser [place], [place]   11 months ago Essential hypertension   [facility] Roosvelt Maser [place], [place]

## 2019-05-16 ENCOUNTER — Other Ambulatory Visit: Payer: Self-pay | Admitting: Family Medicine

## 2019-05-17 NOTE — Telephone Encounter (Signed)
Home number disconnected, lvm on mobile number will also mail letter.

## 2019-05-18 ENCOUNTER — Telehealth: Payer: Self-pay

## 2019-05-18 NOTE — Telephone Encounter (Signed)
Please verify directions/ update on telmisartan 80 mg and resend to the pharmacy [facility]

## 2019-05-21 MED ORDER — TELMISARTAN 80 MG PO TABS: 80.0000 mg | ORAL_TABLET | Freq: Every day | ORAL | 0 refills | Status: DC

## 2019-05-21 NOTE — Telephone Encounter (Signed)
RX updated.

## 2019-05-22 ENCOUNTER — Encounter: Payer: Self-pay | Admitting: Family Medicine

## 2019-07-06 ENCOUNTER — Encounter: Payer: Self-pay | Admitting: Nurse Practitioner

## 2019-07-06 ENCOUNTER — Other Ambulatory Visit: Payer: Self-pay | Admitting: Nurse Practitioner

## 2019-07-06 ENCOUNTER — Ambulatory Visit (INDEPENDENT_AMBULATORY_CARE_PROVIDER_SITE_OTHER): Payer: Managed Care, Other (non HMO) | Admitting: Nurse Practitioner

## 2019-07-06 ENCOUNTER — Other Ambulatory Visit: Payer: Self-pay

## 2019-07-06 DIAGNOSIS — B9689 Other specified bacterial agents as the cause of diseases classified elsewhere: Secondary | ICD-10-CM | POA: Diagnosis not present

## 2019-07-06 DIAGNOSIS — N898 Other specified noninflammatory disorders of vagina: Secondary | ICD-10-CM

## 2019-07-06 DIAGNOSIS — N760 Acute vaginitis: Secondary | ICD-10-CM | POA: Diagnosis not present

## 2019-07-06 LAB — WET PREP FOR TRICH, YEAST, CLUE
Clue Cell Exam: POSITIVE — AB
Trichomonas Exam: NEGATIVE
Yeast Exam: NEGATIVE

## 2019-07-06 MED ORDER — METRONIDAZOLE 500 MG PO TABS: 500.0000 mg | ORAL_TABLET | Freq: Two times a day (BID) | ORAL | 0 refills | Status: AC

## 2019-07-06 NOTE — Progress Notes (Signed)
BP (!) 143/107   Pulse 100   Temp 98 F (36.7 C)   SpO2 96%    Subjective:    Patient ID: Whitney Rodriguez, female    DOB: 1986-10-05, 33 y.o.   MRN: [id number]  HPI: Whitney Rodriguez is a 33 y.o. female  Chief Complaint  Patient presents with  . vaginal lesion    . This visit was completed via WebEx due to the restrictions of the COVID-19 pandemic. All issues as above were discussed and addressed. Physical exam was done as above through visual confirmation on WebEx. If it was felt that the patient should be evaluated in the office, they were directed there. The patient verbally consented to this visit. . Location of the patient: home . Location of the provider: home . Those involved with this call:  . Provider: Marnee Guarneri, DNP . CMA: Tiffany Reel, CMA . Front Desk/Registration: Jill Side  . Time spent on call: 15 minutes with patient face to face via video conference. More than 50% of this time was spent in counseling and coordination of care. 10 minutes total spent in review of patient's record and preparation of their chart.  . I verified patient identity using two factors (patient name and date of birth). Patient consents verbally to being seen via telemedicine visit today.   VAGINAL DISCHARGE Reports some discharge which started yesterday.  Also noticed some "bumps" on outside, but none on the inside.  States the bumps are symmetric/small, more towards the top.  Noticed them shaving last night.  States they are not open sores or blisters.  Denies burning at areas or any discharge from bumps, states there is no discomfort to them.  No currently pregnant. Duration: days Discharge description: white  Pruritus: yes Dysuria: no Malodorous: yes Urinary frequency: no Fevers: no Abdominal pain: no  Sexual activity: monogamous, has been with him for two months History of sexually transmitted diseases: no Recent antibiotic use: no Context: stable  Treatments attempted:  none  Relevant past medical, surgical, family and social history reviewed and updated as indicated. Interim medical history since our last visit reviewed. Allergies and medications reviewed and updated.  Review of Systems  Constitutional: Negative for activity change, appetite change, diaphoresis, fatigue and fever.  Respiratory: Negative for cough, chest tightness and shortness of breath.   Cardiovascular: Negative for chest pain, palpitations and leg swelling.  Gastrointestinal: Negative for abdominal distention, abdominal pain, constipation, diarrhea, nausea and vomiting.  Genitourinary: Positive for vaginal discharge. Negative for dysuria, flank pain, frequency, hematuria, urgency and vaginal pain.  Neurological: Negative for dizziness, syncope, weakness, light-headedness, numbness and headaches.  Psychiatric/Behavioral: Negative.     Per HPI unless specifically indicated above     Objective:    BP (!) 143/107   Pulse 100   Temp 98 F (36.7 C)   SpO2 96%   Wt Readings from Last 3 Encounters:  10/19/18 155 lb 4.8 oz (70.4 kg)  03/30/18 149 lb 3 oz (67.7 kg)  02/16/18 147 lb (66.7 kg)    Physical Exam Vitals signs and nursing note reviewed.  Constitutional:      General: She is awake. She is not in acute distress.    Appearance: She is well-developed. She is not ill-appearing.  HENT:     Head: Normocephalic.     Right Ear: Hearing normal.     Left Ear: Hearing normal.  Eyes:     General: Lids are normal.        Right eye:  No discharge.        Left eye: No discharge.     Conjunctiva/sclera: Conjunctivae normal.  Neck:     Musculoskeletal: Normal range of motion.  Cardiovascular:     Comments: Unable to auscultate due to virtual exam only  Pulmonary:     Effort: Pulmonary effort is normal. No accessory muscle usage or respiratory distress.     Comments: Unable to auscultate due to virtual exam only  Genitourinary:    Comments: Due to virtual visit unable to  perform this exam today. Neurological:     Mental Status: She is alert and oriented to person, place, and time.  Psychiatric:        Attention and Perception: Attention normal.        Mood and Affect: Mood normal.        Behavior: Behavior normal. Behavior is cooperative.        Thought Content: Thought content normal.        Judgment: Judgment normal.     Results for orders placed or performed in visit on 10/19/18  Rapid Strep Screen ([facility] ONLY)   Specimen: Other   OTHER  Result Value Ref Range   Strep Gp A Ag, IA W/Reflex Negative Negative  Culture, Group A Strep   OTHER  Result Value Ref Range   Strep A Culture CANCELED       Assessment & Plan:   Problem List Items Addressed This Visit      Genitourinary   Bacterial vaginosis    Acute with positive clue cells on wet prep, no yeast. Flagyl script sent and discussed with patient to take with food and avoid alcohol while taking.  STD testing ordered due to concern about "bumps" to genital area.  If HSV positive consider Valtrex.  Return for worsening or continued issues + as scheduled.      Relevant Medications   metroNIDAZOLE (FLAGYL) 500 MG tablet    Other Visit Diagnoses    Vaginal discharge       Relevant Orders   HIV Antibody (routine testing w rflx)   HSV(herpes simplex vrs) 1+2 ab-IgG   RPR   GC/Chlamydia Probe Amp      I discussed the assessment and treatment plan with the patient. The patient was provided an opportunity to ask questions and all were answered. The patient agreed with the plan and demonstrated an understanding of the instructions.   The patient was advised to call back or seek an in-person evaluation if the symptoms worsen or if the condition fails to improve as anticipated.   I provided 15 minutes of time during this encounter.  Follow up plan: Return if symptoms worsen or fail to improve.

## 2019-07-06 NOTE — Patient Instructions (Signed)

## 2019-07-06 NOTE — Progress Notes (Signed)
Future lab orders for today

## 2019-07-06 NOTE — Assessment & Plan Note (Signed)
Acute with positive clue cells on wet prep, no yeast. Flagyl script sent and discussed with patient to take with food and avoid alcohol while taking.  STD testing ordered due to concern about "bumps" to genital area.  If HSV positive consider Valtrex.  Return for worsening or continued issues + as scheduled.

## 2019-07-07 LAB — HSV(HERPES SIMPLEX VRS) I + II AB-IGG
HSV 1 Glycoprotein G Ab, IgG: 2.63 index — ABNORMAL HIGH (ref 0.00–0.90)
HSV 2 IgG, Type Spec: <0.91 index (ref 0.00–0.90)

## 2019-07-07 LAB — HIV ANTIBODY (ROUTINE TESTING W REFLEX): HIV Screen 4th Generation wRfx: NONREACTIVE

## 2019-07-07 LAB — RPR: RPR Ser Ql: NONREACTIVE

## 2019-07-08 LAB — UA/M W/RFLX CULTURE, ROUTINE
Bilirubin, UA: NEGATIVE
Glucose, UA: NEGATIVE
Nitrite, UA: NEGATIVE
Protein,UA: NEGATIVE
RBC, UA: NEGATIVE
Specific Gravity, UA: 1.02 (ref 1.005–1.030)
Urobilinogen, Ur: 0.2 mg/dL (ref 0.2–1.0)
pH, UA: 7 (ref 5.0–7.5)

## 2019-07-08 LAB — MICROSCOPIC EXAMINATION: RBC: NONE SEEN /hpf (ref 0–2)

## 2019-07-08 LAB — URINE CULTURE, REFLEX

## 2019-07-09 ENCOUNTER — Ambulatory Visit: Payer: Managed Care, Other (non HMO) | Admitting: Nurse Practitioner

## 2019-07-09 ENCOUNTER — Encounter: Payer: Self-pay | Admitting: Nurse Practitioner

## 2019-07-09 ENCOUNTER — Other Ambulatory Visit: Payer: Self-pay

## 2019-07-09 DIAGNOSIS — T148XXA Other injury of unspecified body region, initial encounter: Secondary | ICD-10-CM

## 2019-07-09 NOTE — Patient Instructions (Signed)

## 2019-07-09 NOTE — Progress Notes (Signed)
BP (!) 146/96   Pulse 98   Temp 99.3 F (37.4 C) (Oral)   Ht 5\' 3"  (1.6 m)   Wt 166 lb (75.3 kg)   SpO2 97%   BMI 29.41 kg/m    Subjective:    Patient ID: Whitney Rodriguez, female    DOB: 1986/11/19, 33 y.o.   MRN: [id number]  HPI: Whitney Rodriguez is a 33 y.o. female  Chief Complaint  Patient presents with  . Vaginal Lesion    f/u pt states she have not started taking flagyl     VAGINAL LESIONS: Was seen on Friday for discharge, diagnosed with BV and ordered Flagyl which she has not started.  At time she reported noticing some lesions to vagina.  HSV 2 testing returned negative.  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  On Friday morning she noticed "lesions" two on outside, symmetric.  Then on Saturday noticed one on inside labia.  Reports they have not gotten worse and appear to be getting better, but she has not been doing anything to irritate them.  They initially presented after shaving, has not shaved to area in awhile.  Denies any pain, burning, or discomfort to areas.  Last sexual activity was last Wednesday.  In monogamous relationship.    Relevant past medical, surgical, family and social history reviewed and updated as indicated. Interim medical history since our last visit reviewed. Allergies and medications reviewed and updated.  Review of Systems  Constitutional: Negative for activity change, appetite change, diaphoresis, fatigue and fever.  Respiratory: Negative for cough, chest tightness and shortness of breath.   Cardiovascular: Negative for chest pain, palpitations and leg swelling.  Gastrointestinal: Negative for abdominal distention, abdominal pain, constipation, diarrhea, nausea and vomiting.  Genitourinary: Positive for vaginal discharge. Negative for vaginal bleeding and vaginal pain.  Neurological: Negative for dizziness, syncope, weakness, light-headedness, numbness and headaches.  Psychiatric/Behavioral: Negative.      Per HPI unless specifically indicated above     Objective:    BP (!) 146/96   Pulse 98   Temp 99.3 F (37.4 C) (Oral)   Ht 5\' 3"  (1.6 m)   Wt 166 lb (75.3 kg)   SpO2 97%   BMI 29.41 kg/m   Wt Readings from Last 3 Encounters:  07/09/19 166 lb (75.3 kg)  10/19/18 155 lb 4.8 oz (70.4 kg)  03/30/18 149 lb 3 oz (67.7 kg)    Physical Exam Vitals signs and nursing note reviewed.  Constitutional:      General: She is awake. She is not in acute distress.    Appearance: She is well-developed. She is not ill-appearing.  HENT:     Head: Normocephalic.     Right Ear: Hearing normal.     Left Ear: Hearing normal.     Nose: Nose normal.     Mouth/Throat:     Mouth: Mucous membranes are moist.  Eyes:     General: Lids are normal.        Right eye: No discharge.        Left eye: No discharge.     Conjunctiva/sclera: Conjunctivae normal.     Pupils: Pupils are equal, round, and reactive to light.  Neck:     Musculoskeletal: Normal range of motion and neck supple.  Cardiovascular:     Rate and Rhythm: Normal rate and regular rhythm.     Heart sounds: Normal heart sounds. No murmur. No gallop.   Pulmonary:  Effort: Pulmonary effort is normal. No accessory muscle usage or respiratory distress.     Breath sounds: Normal breath sounds.  Abdominal:     General: Bowel sounds are normal.     Palpations: Abdomen is soft.  Genitourinary:    Comments: Small, flat area of mild erythema with tiny abrasion to upper outer left labia with no drainage or blister, no tenderness on palpation.  Similar area to middle right outer labia.  Small area of flat, mild erythema to inner middle right labia with no blister or drainage.  Musculoskeletal:     Right lower leg: No edema.     Left lower leg: No edema.  Skin:    General: Skin is warm and dry.  Neurological:     Mental Status: She is alert and oriented to person, place, and time.  Psychiatric:        Attention and Perception: Attention  normal.        Mood and Affect: Mood normal.        Behavior: Behavior normal. Behavior is cooperative.        Thought Content: Thought content normal.        Judgment: Judgment normal.     Results for orders placed or performed in visit on 07/06/19  WET PREP FOR TRICH, YEAST, CLUE   Specimen: Urine   URINE  Result Value Ref Range   Trichomonas Exam Negative Negative   Yeast Exam Negative Negative   Clue Cell Exam Positive (A) Negative  Microscopic Examination   URINE  Result Value Ref Range   WBC, UA 6-10 (A) 0 - 5 /hpf   RBC None seen 0 - 2 /hpf   Epithelial Cells (non renal) 0-10 0 - 10 /hpf   Mucus, UA Present Not Estab.   Bacteria, UA Moderate (A) None seen/Few  Urine Culture, Reflex   URINE  Result Value Ref Range   Urine Culture, Routine Final report    Organism ID, Bacteria Comment   UA/M w/rflx Culture, Routine   Specimen: Urine   URINE  Result Value Ref Range   Specific Gravity, UA 1.020 1.005 - 1.030   pH, UA 7.0 5.0 - 7.5   Color, UA Yellow Yellow   Appearance Ur Cloudy (A) Clear   Leukocytes,UA 3+ (A) Negative   Protein,UA Negative Negative/Trace   Glucose, UA Negative Negative   Ketones, UA Trace (A) Negative   RBC, UA Negative Negative   Bilirubin, UA Negative Negative   Urobilinogen, Ur 0.2 0.2 - 1.0 mg/dL   Nitrite, UA Negative Negative   Microscopic Examination See below:    Urinalysis Reflex Comment   HIV Antibody (routine testing w rflx)  Result Value Ref Range   HIV Screen 4th Generation wRfx Non Reactive Non Reactive  HSV(herpes simplex vrs) 1+2 ab-IgG  Result Value Ref Range   HSV 1 Glycoprotein G Ab, IgG 2.63 (H) 0.00 - 0.90 index   HSV 2 IgG, Type Spec <0.91 0.00 - 0.90 index  RPR  Result Value Ref Range   RPR Ser Ql Non Reactive Non Reactive      Assessment & Plan:   Problem List Items Addressed This Visit      Other   Abrasion    Two to exterior labia and one to interior labia, suspect from shaving.  Areas appear to be  healing.  Have recommended patient continue to monitor.  Start Flagyl script as ordered.  Return for worsening or continued issues.  Follow up plan: Return if symptoms worsen or fail to improve.

## 2019-07-09 NOTE — Assessment & Plan Note (Addendum)
Two to exterior labia and one to interior labia, suspect from shaving.  Areas appear to be healing.  Have recommended patient continue to monitor.  Start Flagyl script as ordered.  Return for worsening or continued issues.

## 2019-07-12 LAB — GC/CHLAMYDIA PROBE AMP
Chlamydia trachomatis, NAA: NEGATIVE
Neisseria Gonorrhoeae by PCR: NEGATIVE

## 2019-08-15 ENCOUNTER — Other Ambulatory Visit: Payer: Self-pay | Admitting: Nurse Practitioner

## 2019-08-15 MED ORDER — LOSARTAN POTASSIUM 50 MG PO TABS: 50.0000 mg | ORAL_TABLET | Freq: Every day | ORAL | 0 refills | Status: DC

## 2019-08-15 NOTE — Progress Notes (Signed)
Losartan refill sent.

## 2019-08-28 ENCOUNTER — Encounter: Payer: Self-pay | Admitting: Nurse Practitioner

## 2019-08-28 ENCOUNTER — Ambulatory Visit (INDEPENDENT_AMBULATORY_CARE_PROVIDER_SITE_OTHER): Payer: Managed Care, Other (non HMO) | Admitting: Nurse Practitioner

## 2019-08-28 ENCOUNTER — Other Ambulatory Visit: Payer: Self-pay

## 2019-08-28 VITALS — BP 130/91 | HR 94 | Temp 98.5°F | Ht 63.0 in | Wt 166.0 lb

## 2019-08-28 DIAGNOSIS — Z13220 Encounter for screening for lipoid disorders: Secondary | ICD-10-CM | POA: Diagnosis not present

## 2019-08-28 DIAGNOSIS — I10 Essential (primary) hypertension: Secondary | ICD-10-CM

## 2019-08-28 MED ORDER — LOSARTAN POTASSIUM 100 MG PO TABS: 100.0000 mg | ORAL_TABLET | Freq: Every day | ORAL | 3 refills | Status: DC

## 2019-08-28 NOTE — Assessment & Plan Note (Signed)
Chronic, ongoing.  Would benefit from Losartan increase to 100 MG, which she reports she had been taking.  Script sent.  CMP and lipid panel today.  Recommend she check BP every morning at home.  Return in 6 months for annual physical.

## 2019-08-28 NOTE — Progress Notes (Signed)
BP (!) 130/91   Pulse 94   Temp 98.5 F (36.9 C) (Oral)   Ht 5\' 3"  (1.6 m)   Wt 166 lb (75.3 kg)   SpO2 98%   BMI 29.41 kg/m    Subjective:    Patient ID: Whitney Rodriguez, female    DOB: 05-22-1986, 33 y.o.   MRN: [id number]  HPI: Whitney Rodriguez is a 33 y.o. female  Chief Complaint  Patient presents with  . Hypertension    losartan refill   HYPERTENSION Continues on Losartan 50 MG daily, but states she had been taking 100 MG at some point and feels that worked better. Hypertension status: stable  Satisfied with current treatment? yes Duration of hypertension: chronic BP monitoring frequency:  not checking BP range:  BP medication side effects:  no Medication compliance: good compliance Aspirin: no Recurrent headaches: no Visual changes: no Palpitations: no Dyspnea: no Chest pain: no Lower extremity edema: no Dizzy/lightheaded: no  Relevant past medical, surgical, family and social history reviewed and updated as indicated. Interim medical history since our last visit reviewed. Allergies and medications reviewed and updated.  Review of Systems  Constitutional: Negative for activity change, appetite change, diaphoresis, fatigue and fever.  Respiratory: Negative for cough, chest tightness, shortness of breath and wheezing.   Cardiovascular: Negative for chest pain, palpitations and leg swelling.  Gastrointestinal: Negative for abdominal distention, abdominal pain, constipation, diarrhea, nausea and vomiting.  Endocrine: Negative for cold intolerance and heat intolerance.  Neurological: Negative for dizziness, syncope, weakness, light-headedness, numbness and headaches.  Psychiatric/Behavioral: Negative.     Per HPI unless specifically indicated above     Objective:    BP (!) 130/91   Pulse 94   Temp 98.5 F (36.9 C) (Oral)   Ht 5\' 3"  (1.6 m)   Wt 166 lb (75.3 kg)   SpO2 98%   BMI 29.41 kg/m   Wt Readings from Last 3 Encounters:  08/28/19 166 lb (75.3  kg)  07/09/19 166 lb (75.3 kg)  10/19/18 155 lb 4.8 oz (70.4 kg)    Physical Exam Vitals signs and nursing note reviewed.  Constitutional:      General: She is awake. She is not in acute distress.    Appearance: She is well-developed. She is not ill-appearing.  HENT:     Head: Normocephalic.     Right Ear: Hearing normal.     Left Ear: Hearing normal.     Nose: Nose normal.     Mouth/Throat:     Mouth: Mucous membranes are moist.  Eyes:     General: Lids are normal.        Right eye: No discharge.        Left eye: No discharge.     Conjunctiva/sclera: Conjunctivae normal.     Pupils: Pupils are equal, round, and reactive to light.  Neck:     Musculoskeletal: Normal range of motion and neck supple.     Thyroid: No thyromegaly.     Vascular: No carotid bruit.  Cardiovascular:     Rate and Rhythm: Normal rate and regular rhythm.     Heart sounds: Normal heart sounds. No murmur. No gallop.   Pulmonary:     Effort: Pulmonary effort is normal. No accessory muscle usage or respiratory distress.     Breath sounds: Normal breath sounds.  Abdominal:     General: Bowel sounds are normal.     Palpations: Abdomen is soft.  Musculoskeletal:     Right lower leg:  No edema.     Left lower leg: No edema.  Lymphadenopathy:     Cervical: No cervical adenopathy.  Skin:    General: Skin is warm and dry.  Neurological:     Mental Status: She is alert and oriented to person, place, and time.  Psychiatric:        Attention and Perception: Attention normal.        Mood and Affect: Mood normal.        Behavior: Behavior normal. Behavior is cooperative.        Thought Content: Thought content normal.        Judgment: Judgment normal.     Results for orders placed or performed in visit on 07/06/19  WET PREP FOR TRICH, YEAST, CLUE   Specimen: Urine   URINE  Result Value Ref Range   Trichomonas Exam Negative Negative   Yeast Exam Negative Negative   Clue Cell Exam Positive (A) Negative   GC/Chlamydia Probe Amp   Specimen: Urine   UR  Result Value Ref Range   Chlamydia trachomatis, NAA Negative Negative   Neisseria Gonorrhoeae by PCR Negative Negative  Microscopic Examination   URINE  Result Value Ref Range   WBC, UA 6-10 (A) 0 - 5 /hpf   RBC None seen 0 - 2 /hpf   Epithelial Cells (non renal) 0-10 0 - 10 /hpf   Mucus, UA Present Not Estab.   Bacteria, UA Moderate (A) None seen/Few  Urine Culture, Reflex   URINE  Result Value Ref Range   Urine Culture, Routine Final report    Organism ID, Bacteria Comment   UA/M w/rflx Culture, Routine   Specimen: Urine   URINE  Result Value Ref Range   Specific Gravity, UA 1.020 1.005 - 1.030   pH, UA 7.0 5.0 - 7.5   Color, UA Yellow Yellow   Appearance Ur Cloudy (A) Clear   Leukocytes,UA 3+ (A) Negative   Protein,UA Negative Negative/Trace   Glucose, UA Negative Negative   Ketones, UA Trace (A) Negative   RBC, UA Negative Negative   Bilirubin, UA Negative Negative   Urobilinogen, Ur 0.2 0.2 - 1.0 mg/dL   Nitrite, UA Negative Negative   Microscopic Examination See below:    Urinalysis Reflex Comment   HIV Antibody (routine testing w rflx)  Result Value Ref Range   HIV Screen 4th Generation wRfx Non Reactive Non Reactive  HSV(herpes simplex vrs) 1+2 ab-IgG  Result Value Ref Range   HSV 1 Glycoprotein G Ab, IgG 2.63 (H) 0.00 - 0.90 index   HSV 2 IgG, Type Spec <0.91 0.00 - 0.90 index  RPR  Result Value Ref Range   RPR Ser Ql Non Reactive Non Reactive      Assessment & Plan:   Problem List Items Addressed This Visit      Cardiovascular and Mediastinum   Essential hypertension    Chronic, ongoing.  Would benefit from Losartan increase to 100 MG, which she reports she had been taking.  Script sent.  CMP and lipid panel today.  Recommend she check BP every morning at home.  Return in 6 months for annual physical.      Relevant Medications   losartan (COZAAR) 100 MG tablet   Other Relevant Orders   Comprehensive  metabolic panel    Other Visit Diagnoses    Screening cholesterol level    -  Primary   Relevant Orders   Lipid Panel w/o Chol/HDL Ratio  Follow up plan: Return in about 6 months (around 02/25/2020) for Annual physical (she thinks pap will be due) -- Apolonio Schneiders patient.

## 2019-08-28 NOTE — Patient Instructions (Signed)

## 2019-08-29 LAB — COMPREHENSIVE METABOLIC PANEL
ALT: 15 IU/L (ref 0–32)
AST: 17 IU/L (ref 0–40)
Albumin/Globulin Ratio: 1.7 (ref 1.2–2.2)
Albumin: 4.2 g/dL (ref 3.8–4.8)
Alkaline Phosphatase: 114 IU/L (ref 39–117)
BUN/Creatinine Ratio: 7 — ABNORMAL LOW (ref 9–23)
BUN: 6 mg/dL (ref 6–20)
Bilirubin Total: 0.6 mg/dL (ref 0.0–1.2)
CO2: 26 mmol/L (ref 20–29)
Calcium: 9.3 mg/dL (ref 8.7–10.2)
Chloride: 97 mmol/L (ref 96–106)
Creatinine, Ser: 0.84 mg/dL (ref 0.57–1.00)
GFR calc Af Amer: 104 mL/min/{1.73_m2} (ref >59)
GFR calc non Af Amer: 90 mL/min/{1.73_m2} (ref >59)
Globulin, Total: 2.5 g/dL (ref 1.5–4.5)
Glucose: 86 mg/dL (ref 65–99)
Potassium: 5 mmol/L (ref 3.5–5.2)
Sodium: 138 mmol/L (ref 134–144)
Total Protein: 6.7 g/dL (ref 6.0–8.5)

## 2019-08-29 LAB — LIPID PANEL W/O CHOL/HDL RATIO
Cholesterol, Total: 143 mg/dL (ref 100–199)
HDL: 40 mg/dL (ref >39)
LDL Chol Calc (NIH): 74 mg/dL (ref 0–99)
Triglycerides: 173 mg/dL — ABNORMAL HIGH (ref 0–149)
VLDL Cholesterol Cal: 29 mg/dL (ref 5–40)

## 2019-09-07 ENCOUNTER — Other Ambulatory Visit: Payer: Self-pay | Admitting: Nurse Practitioner

## 2019-09-21 ENCOUNTER — Encounter: Payer: Self-pay | Admitting: Family Medicine

## 2019-09-21 ENCOUNTER — Ambulatory Visit (INDEPENDENT_AMBULATORY_CARE_PROVIDER_SITE_OTHER): Payer: Managed Care, Other (non HMO) | Admitting: Family Medicine

## 2019-09-21 ENCOUNTER — Other Ambulatory Visit: Payer: Self-pay

## 2019-09-21 VITALS — BP 167/122 | HR 85 | Temp 98.4°F

## 2019-09-21 DIAGNOSIS — N898 Other specified noninflammatory disorders of vagina: Secondary | ICD-10-CM

## 2019-09-21 MED ORDER — VALACYCLOVIR HCL 1 G PO TABS: 1000.0000 mg | ORAL_TABLET | Freq: Two times a day (BID) | ORAL | 0 refills | Status: DC

## 2019-09-21 NOTE — Progress Notes (Signed)
BP (!) 167/122   Pulse 85   Temp 98.4 F (36.9 C)   SpO2 100%    Subjective:    Patient ID: Whitney Rodriguez, female    DOB: 1986/07/12, 33 y.o.   MRN: [id number]  HPI: Whitney Rodriguez is a 33 y.o. female  Chief Complaint  Patient presents with  . Vaginal Pain    right outter lip, the last 3 days it has not been getting better, has been there for about a week   Patient presenting today for persistent vaginal labial sores the past few weeks. Current sore is on right labia, tingling and mildly painful particular when pressure applied. These were thought to be from shaving so she stopped shaving the past week or two. Has only been using gentle hygiene products. Denies itching, discharge, exposure to STIs, fever, abdominal pain, chills. Not currently using anything on the areas. 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   Relevant past medical, surgical, family and social history reviewed and updated as indicated. Interim medical history since our last visit reviewed. Allergies and medications reviewed and updated.  Review of Systems  Per HPI unless specifically indicated above     Objective:    BP (!) 167/122   Pulse 85   Temp 98.4 F (36.9 C)   SpO2 100%   Wt Readings from Last 3 Encounters:  08/28/19 166 lb (75.3 kg)  07/09/19 166 lb (75.3 kg)  10/19/18 155 lb 4.8 oz (70.4 kg)    Physical Exam Vitals signs and nursing note reviewed.  Constitutional:      Appearance: Normal appearance. She is not ill-appearing.  HENT:     Head: Atraumatic.  Eyes:     Extraocular Movements: Extraocular movements intact.     Conjunctiva/sclera: Conjunctivae normal.  Neck:     Musculoskeletal: Normal range of motion and neck supple.  Cardiovascular:     Rate and Rhythm: Normal rate and regular rhythm.     Heart sounds: Normal heart sounds.  Pulmonary:     Effort: Pulmonary effort is normal.     Breath sounds: Normal breath sounds.  Musculoskeletal: Normal range of motion.  Skin:  General: Skin is warm and dry.     Comments: Vesicular lesion right external labia  Neurological:     Mental Status: She is alert and oriented to person, place, and time.  Psychiatric:        Mood and Affect: Mood normal.        Thought Content: Thought content normal.        Judgment: Judgment normal.     Results for orders placed or performed in visit on 08/28/19  Comprehensive metabolic panel  Result Value Ref Range   Glucose 86 65 - 99 mg/dL   BUN 6 6 - 20 mg/dL   Creatinine, Ser 0.84 0.57 - 1.00 mg/dL   GFR calc non Af Amer 90 >59 mL/min/1.73   GFR calc Af Amer 104 >59 mL/min/1.73   BUN/Creatinine Ratio 7 (L) 9 - 23   Sodium 138 134 - 144 mmol/L   Potassium 5.0 3.5 - 5.2 mmol/L   Chloride 97 96 - 106 mmol/L   CO2 26 20 - 29 mmol/L   Calcium 9.3 8.7 - 10.2 mg/dL   Total Protein 6.7 6.0 - 8.5 g/dL   Albumin 4.2 3.8 - 4.8 g/dL   Globulin, Total 2.5 1.5 - 4.5 g/dL   Albumin/Globulin Ratio 1.7 1.2 - 2.2   Bilirubin Total 0.6 0.0 - 1.2 mg/dL  Alkaline Phosphatase 114 39 - 117 IU/L   AST 17 0 - 40 IU/L   ALT 15 0 - 32 IU/L  Lipid Panel w/o Chol/HDL Ratio  Result Value Ref Range   Cholesterol, Total 143 100 - 199 mg/dL   Triglycerides 948 (H) 0 - 149 mg/dL   HDL 40 >54 mg/dL   VLDL Cholesterol Cal 29 5 - 40 mg/dL   LDL Chol Calc (NIH) 74 0 - 99 mg/dL      Assessment & Plan:   Problem List Items Addressed This Visit    None    Visit Diagnoses    Vaginal lesion    -  Primary   Given appearance and HSV + results, suspect HSV lesion. Tx with prn valtrex and monitor for benefit. Transmission risks reviewed. F/u if not resolving       Follow up plan: Return if symptoms worsen or fail to improve.

## 2019-10-02 ENCOUNTER — Other Ambulatory Visit: Payer: Self-pay | Admitting: Family Medicine

## 2019-10-02 DIAGNOSIS — N898 Other specified noninflammatory disorders of vagina: Secondary | ICD-10-CM

## 2019-10-02 NOTE — Progress Notes (Signed)
r 

## 2019-10-10 ENCOUNTER — Other Ambulatory Visit: Payer: Self-pay

## 2019-10-10 MED ORDER — VALACYCLOVIR HCL 1 G PO TABS: 1000.0000 mg | ORAL_TABLET | Freq: Two times a day (BID) | ORAL | 0 refills | Status: DC

## 2019-10-31 ENCOUNTER — Other Ambulatory Visit: Payer: Self-pay | Admitting: Nurse Practitioner

## 2019-10-31 NOTE — Telephone Encounter (Signed)
LVM to call us back

## 2019-10-31 NOTE — Telephone Encounter (Signed)
Has refills available

## 2019-10-31 NOTE — Telephone Encounter (Signed)
Routing to proivder 

## 2019-10-31 NOTE — Telephone Encounter (Signed)
Requested medication (s) are due for refill today: no  Requested medication (s) are on the active medication list: no  Last refill:  08/15/2019  Future visit scheduled: yes  Notes to clinic: medication has been discontinued    Requested Prescriptions  Pending Prescriptions Disp Refills   losartan (COZAAR) 50 MG tablet [Pharmacy Med Name: LOSARTAN POTASSIUM 50 MG TAB] 30 tablet 0    Sig: TAKE 1 TABLET BY MOUTH EVERY DAY     Cardiovascular:  Angiotensin Receptor Blockers Failed - 10/31/2019  3:04 PM      Failed - Last BP in normal range    BP Readings from Last 1 Encounters:  09/21/19 (!) 167/122         Passed - Cr in normal range and within 180 days    Creatinine, Ser  Date Value Ref Range Status  08/28/2019 0.84 0.57 - 1.00 mg/dL Final         Passed - K in normal range and within 180 days    Potassium  Date Value Ref Range Status  08/28/2019 5.0 3.5 - 5.2 mmol/L Final         Passed - Patient is not pregnant      Passed - Valid encounter within last 6 months    Recent Outpatient Visits          1 month ago Vaginal lesion   [facility] Volney American, PA-C   2 months ago Screening cholesterol level   [place], Dorothy T, NP   3 months ago [place], Gas T, NP   3 months ago Vaginal discharge   [place], [place] T, NP   1 year ago Non-recurrent acute suppurative otitis media of left ear without spontaneous rupture of tympanic membrane   [facility] [place], [place], DO      Future Appointments            In 4 months Orene Desanctis, Lilia Argue, [place], [place]

## 2019-11-01 NOTE — Telephone Encounter (Signed)
LVM on 11/01/2019

## 2019-11-05 NOTE — Telephone Encounter (Signed)
Called Pt she stated she has gotten her medication.

## 2020-03-03 ENCOUNTER — Encounter: Payer: 59 | Admitting: Family Medicine

## 2020-03-04 ENCOUNTER — Other Ambulatory Visit: Payer: Self-pay | Admitting: Family Medicine

## 2020-03-04 NOTE — Telephone Encounter (Signed)
Patient last seen 09/21/19

## 2020-03-05 ENCOUNTER — Encounter: Payer: Self-pay | Admitting: Family Medicine

## 2020-03-05 ENCOUNTER — Other Ambulatory Visit: Payer: Self-pay

## 2020-03-05 ENCOUNTER — Ambulatory Visit: Payer: 59 | Admitting: Family Medicine

## 2020-03-05 ENCOUNTER — Telehealth: Payer: Self-pay | Admitting: Family Medicine

## 2020-03-05 ENCOUNTER — Other Ambulatory Visit: Payer: Self-pay | Admitting: Family Medicine

## 2020-03-05 VITALS — BP 145/110 | HR 84 | Temp 98.3°F | Ht 61.0 in | Wt 169.0 lb

## 2020-03-05 DIAGNOSIS — N898 Other specified noninflammatory disorders of vagina: Secondary | ICD-10-CM | POA: Diagnosis not present

## 2020-03-05 DIAGNOSIS — N760 Acute vaginitis: Secondary | ICD-10-CM

## 2020-03-05 DIAGNOSIS — B9689 Other specified bacterial agents as the cause of diseases classified elsewhere: Secondary | ICD-10-CM

## 2020-03-05 DIAGNOSIS — I10 Essential (primary) hypertension: Secondary | ICD-10-CM

## 2020-03-05 LAB — WET PREP FOR TRICH, YEAST, CLUE
Clue Cell Exam: POSITIVE — AB
Trichomonas Exam: NEGATIVE
Yeast Exam: NEGATIVE

## 2020-03-05 MED ORDER — TRAMADOL HCL 50 MG PO TABS: 50.0000 mg | ORAL_TABLET | Freq: Three times a day (TID) | ORAL | 0 refills | Status: AC | PRN

## 2020-03-05 MED ORDER — VALACYCLOVIR HCL 1 G PO TABS: 1000.0000 mg | ORAL_TABLET | Freq: Two times a day (BID) | ORAL | 2 refills | Status: DC

## 2020-03-05 MED ORDER — DOXYCYCLINE HYCLATE 100 MG PO TABS: 100.0000 mg | ORAL_TABLET | Freq: Two times a day (BID) | ORAL | 0 refills | Status: DC

## 2020-03-05 MED ORDER — METRONIDAZOLE 500 MG PO TABS: 500.0000 mg | ORAL_TABLET | Freq: Two times a day (BID) | ORAL | 0 refills | Status: DC

## 2020-03-05 NOTE — Assessment & Plan Note (Signed)
Elevated today, per patient home readings consistently in normal range. Will continue to monitor closely on current regimen

## 2020-03-05 NOTE — Progress Notes (Signed)
BP (!) 145/110   Pulse 84   Temp 98.3 F (36.8 C) (Oral)   Ht 5\' 1"  (1.549 m)   Wt 169 lb (76.7 kg)   SpO2 98%   BMI 31.93 kg/m    Subjective:    Patient ID: Whitney Rodriguez, female    DOB: 06/20/1986, 34 y.o.   MRN: [id number]  HPI: Whitney Rodriguez is a 34 y.o. female  Chief Complaint  Patient presents with  . Vaginal Pain    discomfort and swelling arond vaginal area since this past Monday. pt states has taken 2 pills valtrex yesterday from her frineds RX   About 2 days of vaginal lesion that is round, raised and irritated on inner labia, and a burning lesion on right outer labia. Took some valtrex from her friends prescription without relief. Denies itching, burning, fevers, chills. Possible hx of herpes lesions, but this is not confirmed. Denies fevers, chills, abdominal pain, concern for STIs.   Did not take BP medications yesterday, otherwise BPs doing well overall when checked at home.   Relevant past medical, surgical, family and social history reviewed and updated as indicated. Interim medical history since our last visit reviewed. Allergies and medications reviewed and updated.  Review of Systems  Per HPI unless specifically indicated above     Objective:    BP (!) 145/110   Pulse 84   Temp 98.3 F (36.8 C) (Oral)   Ht 5\' 1"  (1.549 m)   Wt 169 lb (76.7 kg)   SpO2 98%   BMI 31.93 kg/m   Wt Readings from Last 3 Encounters:  03/05/20 169 lb (76.7 kg)  08/28/19 166 lb (75.3 kg)  07/09/19 166 lb (75.3 kg)    Physical Exam Vitals and nursing note reviewed. Exam conducted with a chaperone present.  Constitutional:      Appearance: Normal appearance. She is not ill-appearing.  HENT:     Head: Atraumatic.  Eyes:     Extraocular Movements: Extraocular movements intact.     Conjunctiva/sclera: Conjunctivae normal.  Cardiovascular:     Rate and Rhythm: Normal rate and regular rhythm.     Heart sounds: Normal heart sounds.  Pulmonary:     Effort: Pulmonary  effort is normal.     Breath sounds: Normal breath sounds.  Genitourinary:    Vagina: Vaginal discharge (copious white vaginal discharge present) present.     Comments: 1 cm round, soft significantly tender papule on inner labia minora on right side, erythematous Small blistered lesion right labia majora with erythematous base Musculoskeletal:        General: Normal range of motion.     Cervical back: Normal range of motion and neck supple.  Skin:    General: Skin is warm and dry.  Neurological:     Mental Status: She is alert and oriented to person, place, and time.  Psychiatric:        Mood and Affect: Mood normal.        Thought Content: Thought content normal.        Judgment: Judgment normal.     Results for orders placed or performed in visit on 08/28/19  Comprehensive metabolic panel  Result Value Ref Range   Glucose 86 65 - 99 mg/dL   BUN 6 6 - 20 mg/dL   Creatinine, Ser 07/11/19 0.57 - 1.00 mg/dL   GFR calc non Af Amer 90 >59 mL/min/1.73   GFR calc Af Amer 104 >59 mL/min/1.73   BUN/Creatinine Ratio 7 (L)  9 - 23   Sodium 138 134 - 144 mmol/L   Potassium 5.0 3.5 - 5.2 mmol/L   Chloride 97 96 - 106 mmol/L   CO2 26 20 - 29 mmol/L   Calcium 9.3 8.7 - 10.2 mg/dL   Total Protein 6.7 6.0 - 8.5 g/dL   Albumin 4.2 3.8 - 4.8 g/dL   Globulin, Total 2.5 1.5 - 4.5 g/dL   Albumin/Globulin Ratio 1.7 1.2 - 2.2   Bilirubin Total 0.6 0.0 - 1.2 mg/dL   Alkaline Phosphatase 114 39 - 117 IU/L   AST 17 0 - 40 IU/L   ALT 15 0 - 32 IU/L  Lipid Panel w/o Chol/HDL Ratio  Result Value Ref Range   Cholesterol, Total 143 100 - 199 mg/dL   Triglycerides 173 (H) 0 - 149 mg/dL   HDL 40 >39 mg/dL   VLDL Cholesterol Cal 29 5 - 40 mg/dL   LDL Chol Calc (NIH) 74 0 - 99 mg/dL      Assessment & Plan:   Problem List Items Addressed This Visit      Cardiovascular and Mediastinum   Essential hypertension    Elevated today, per patient home readings consistently in normal range. Will continue to  monitor closely on current regimen       Other Visit Diagnoses    Vaginal lesion    -  Primary   Outer lesion appears herpetic, inner lesion appears cystic/infected. Tx with valtrex, abx, warm compresses, sitz baths. F/u ASAP with GYN for recheck and exam   BV (bacterial vaginosis)       wet prep +, tx with flagyl, good probiotic regimen, unscented soaps, etc reviewed.    Relevant Medications   valACYclovir (VALTREX) 1000 MG tablet   metroNIDAZOLE (FLAGYL) 500 MG tablet   Other Relevant Orders   WET PREP FOR TRICH, YEAST, CLUE       Follow up plan: Return if symptoms worsen or fail to improve.

## 2020-03-05 NOTE — Telephone Encounter (Signed)
Patient sent in a mychart message stating the exact same information. That message has already been sent to [facility] for review, will close this note out.

## 2020-03-05 NOTE — Telephone Encounter (Signed)
Copied from CRM [id number]. Topic: General - Other >> Mar 05, 2020  4:20 PM Tamela Oddi wrote: Reason for CRM: Patient called to request that the doctor send her in some pain medication.  She had told the doctor at her appt. That she did not need any, but she stated that she is in more pain and would like something sent to her local pharmacy.  Please advise and call if there are any questions at [phone]

## 2020-03-28 ENCOUNTER — Other Ambulatory Visit: Payer: Self-pay | Admitting: Nurse Practitioner

## 2020-07-24 ENCOUNTER — Encounter: Payer: Self-pay | Admitting: Family Medicine

## 2020-07-25 ENCOUNTER — Other Ambulatory Visit: Payer: Self-pay | Admitting: Nurse Practitioner

## 2020-07-25 ENCOUNTER — Other Ambulatory Visit: Payer: Self-pay | Admitting: Family Medicine

## 2020-07-25 MED ORDER — VALACYCLOVIR HCL 1 G PO TABS: 1000.0000 mg | ORAL_TABLET | Freq: Two times a day (BID) | ORAL | 4 refills | Status: DC

## 2020-09-07 ENCOUNTER — Other Ambulatory Visit: Payer: Self-pay | Admitting: Nurse Practitioner

## 2020-09-07 NOTE — Telephone Encounter (Signed)
Requested medication (s) are due for refill today: no  Requested medication (s) are on the active medication list:yes   Last refill:  08/09/2020  Future visit scheduled: no  Notes to clinic:  overdue for follow up appointment    Requested Prescriptions  Pending Prescriptions Disp Refills   losartan (COZAAR) 100 MG tablet [Pharmacy Med Name: LOSARTAN POTASSIUM 100 MG TAB] 30 tablet 11    Sig: TAKE 1 TABLET BY MOUTH EVERY DAY      Cardiovascular:  Angiotensin Receptor Blockers Failed - 09/07/2020  9:03 AM      Failed - Cr in normal range and within 180 days    Creatinine, Ser  Date Value Ref Range Status  08/28/2019 0.84 0.57 - 1.00 mg/dL Final   Creatinine, Urine  Date Value Ref Range Status  10/08/2013 77.47 mg/dL Final          Failed - K in normal range and within 180 days    Potassium  Date Value Ref Range Status  08/28/2019 5.0 3.5 - 5.2 mmol/L Final          Failed - Last BP in normal range    BP Readings from Last 1 Encounters:  03/05/20 (!) 145/110          Failed - Valid encounter within last 6 months    Recent Outpatient Visits           6 months ago Vaginal lesion   [facility] Particia [place]earing, [place]   11 months ago Vaginal lesion   [facility] Particia [place], [place]   1 year ago Screening cholesterol level   [facility] [place], [place] T, NP   1 year ago Abrasion   [facility] [place], [place] T, NP   1 year ago Vaginal discharge   [facility] [place], Dorie Rank, NP              Passed - Patient is not pregnant

## 2020-09-08 NOTE — Telephone Encounter (Signed)
Routing to provider  

## 2021-01-07 ENCOUNTER — Other Ambulatory Visit: Payer: Self-pay

## 2021-01-08 MED ORDER — LOSARTAN POTASSIUM 100 MG PO TABS: 100.0000 mg | ORAL_TABLET | Freq: Every day | ORAL | 0 refills | Status: DC

## 2021-03-12 ENCOUNTER — Other Ambulatory Visit: Payer: Self-pay | Admitting: Nurse Practitioner

## 2021-03-12 NOTE — Telephone Encounter (Signed)
Requested medication (s) are due for refill today: yes  Requested medication (s) are on the active medication list: yes  Last refill:  01/08/21 #30 0 refill  Future visit scheduled: no  Notes to clinic:  per protocol, patient called for future appt. No answer, left voicemail to call clinic and schedule appt prior to refill     Requested Prescriptions  Pending Prescriptions Disp Refills   losartan (COZAAR) 100 MG tablet [Pharmacy Med Name: LOSARTAN POTASSIUM 100 MG TAB] 30 tablet 0    Sig: Take 1 tablet (100 mg total) by mouth daily. Need appointment for further refills.      Cardiovascular:  Angiotensin Receptor Blockers Failed - 03/12/2021 12:46 PM      Failed - Cr in normal range and within 180 days    Creatinine, Ser  Date Value Ref Range Status  08/28/2019 0.84 0.57 - 1.00 mg/dL Final   Creatinine, Urine  Date Value Ref Range Status  10/08/2013 77.47 mg/dL Final          Failed - K in normal range and within 180 days    Potassium  Date Value Ref Range Status  08/28/2019 5.0 3.5 - 5.2 mmol/L Final          Failed - Last BP in normal range    BP Readings from Last 1 Encounters:  03/05/20 (!) 145/110          Failed - Valid encounter within last 6 months    Recent Outpatient Visits           1 year ago Vaginal lesion   [facility] Particia Nearing, [place]   1 year ago Vaginal lesion   [facility] Particia [place], [place]   1 year ago Screening cholesterol level   [facility] [place], [place] T, NP   1 year ago Abrasion   [facility] [place], Preston T, NP   1 year ago Vaginal discharge   [facility] [place], Dorie Rank, NP                Passed - Patient is not pregnant

## 2021-03-12 NOTE — Telephone Encounter (Signed)
Routing to provider  

## 2021-03-12 NOTE — Telephone Encounter (Signed)
Courtesy refill of 30 days given for patient to make appointment.

## 2021-03-12 NOTE — Telephone Encounter (Signed)
Please get patient scheduled.  °

## 2021-03-13 NOTE — Telephone Encounter (Signed)
Called to schedule  no answer lvm

## 2021-03-18 NOTE — Telephone Encounter (Signed)
Lvm to schedule

## 2021-03-20 ENCOUNTER — Encounter: Payer: Self-pay | Admitting: Nurse Practitioner

## 2021-03-20 NOTE — Telephone Encounter (Signed)
Lvm to schedule. Sent mychart letter and mailed letter

## 2021-04-14 ENCOUNTER — Other Ambulatory Visit: Payer: Self-pay | Admitting: Nurse Practitioner

## 2021-05-21 ENCOUNTER — Other Ambulatory Visit: Payer: Self-pay | Admitting: Nurse Practitioner

## 2021-05-21 MED ORDER — LOSARTAN POTASSIUM 100 MG PO TABS: 100.0000 mg | ORAL_TABLET | Freq: Every day | ORAL | 0 refills | Status: DC

## 2021-05-21 NOTE — Telephone Encounter (Signed)
Medication Refill - Medication: losartan (COZAAR) 100 MG tablet     Preferred Pharmacy (with phone number or street name): [id number] - [place], [place] - [address]  Agent: Please be advised that RX refills may take up to 3 business days. We ask that you follow-up with your pharmacy.

## 2021-05-21 NOTE — Telephone Encounter (Signed)
Appointment 05/26/21- courtesy RF has already been given- #7 given to cover patient until appointment and RF sent to pharmacy

## 2021-05-22 ENCOUNTER — Other Ambulatory Visit: Payer: Self-pay | Admitting: Nurse Practitioner

## 2021-05-25 ENCOUNTER — Other Ambulatory Visit: Payer: Self-pay | Admitting: Nurse Practitioner

## 2021-05-25 MED ORDER — VALACYCLOVIR HCL 1 G PO TABS: 1000.0000 mg | ORAL_TABLET | Freq: Two times a day (BID) | ORAL | 4 refills | Status: DC

## 2021-05-25 NOTE — Telephone Encounter (Signed)
  Notes to clinic Pt received 7 days worth 3 days ago, has appt Tuesday, 6/6, please assess after appt.

## 2021-05-26 ENCOUNTER — Other Ambulatory Visit: Payer: Self-pay

## 2021-05-26 ENCOUNTER — Ambulatory Visit: Payer: 59 | Admitting: Internal Medicine

## 2021-05-26 ENCOUNTER — Encounter: Payer: Self-pay | Admitting: Internal Medicine

## 2021-05-26 VITALS — BP 138/89 | HR 98 | Temp 99.3°F | Ht 62.05 in | Wt 168.4 lb

## 2021-05-26 DIAGNOSIS — I10 Essential (primary) hypertension: Secondary | ICD-10-CM

## 2021-05-26 DIAGNOSIS — B009 Herpesviral infection, unspecified: Secondary | ICD-10-CM

## 2021-05-26 LAB — URINALYSIS, ROUTINE W REFLEX MICROSCOPIC
Bilirubin, UA: NEGATIVE
Glucose, UA: NEGATIVE
Ketones, UA: NEGATIVE
Nitrite, UA: NEGATIVE
Protein,UA: NEGATIVE
Specific Gravity, UA: 1.015 (ref 1.005–1.030)
Urobilinogen, Ur: 0.2 mg/dL (ref 0.2–1.0)
pH, UA: 7 (ref 5.0–7.5)

## 2021-05-26 LAB — MICROSCOPIC EXAMINATION

## 2021-05-26 MED ORDER — LOSARTAN POTASSIUM 100 MG PO TABS: 100.0000 mg | ORAL_TABLET | Freq: Every day | ORAL | 1 refills | Status: DC

## 2021-05-26 MED ORDER — VALACYCLOVIR HCL 1 G PO TABS: 1000.0000 mg | ORAL_TABLET | Freq: Two times a day (BID) | ORAL | 0 refills | Status: DC

## 2021-05-26 NOTE — Progress Notes (Signed)
BP 138/89   Pulse 98   Temp 99.3 F (37.4 C) (Oral)   Ht 5' 2.05" (1.576 m)   Wt 168 lb 6.4 oz (76.4 kg)   LMP 05/18/2021   SpO2 98%   BMI 30.75 kg/m    Subjective:    Patient ID: Whitney Rodriguez, female    DOB: Nov 27, 1986, 35 y.o.   MRN: [id number]  Chief Complaint  Patient presents with  . Hypertension  . Medication Refill    Needs refill of Losartan    HPI: Whitney Rodriguez is a 35 y.o. female  Pt is here for a follow up on HTN / Vaginal HSV infections per pt Has vaginal/ labial  lesions ? Sec to HSV +ve in the past. Seen Obgyn for such as well. Is on valtrex prn for such   Hypertension This is a chronic problem. The problem has been waxing and waning since onset. The problem is uncontrolled. Pertinent negatives include no anxiety, blurred vision, chest pain, headaches, malaise/fatigue, neck pain, orthopnea, palpitations, peripheral edema, PND or shortness of breath.  Medication Refill Pertinent negatives include no chest pain, headaches or neck pain.    Chief Complaint  Patient presents with  . Hypertension  . Medication Refill    Needs refill of Losartan    Relevant past medical, surgical, family and social history reviewed and updated as indicated. Interim medical history since our last visit reviewed. Allergies and medications reviewed and updated.  Review of Systems  Constitutional: Negative for malaise/fatigue.  Eyes: Negative for blurred vision.  Respiratory: Negative for shortness of breath.   Cardiovascular: Negative for chest pain, palpitations, orthopnea and PND.  Musculoskeletal: Negative for neck pain.  Neurological: Negative for headaches.    Per HPI unless specifically indicated above     Objective:    BP 138/89   Pulse 98   Temp 99.3 F (37.4 C) (Oral)   Ht 5' 2.05" (1.576 m)   Wt 168 lb 6.4 oz (76.4 kg)   LMP 05/18/2021   SpO2 98%   BMI 30.75 kg/m   Wt Readings from Last 3 Encounters:  05/26/21 168 lb 6.4 oz (76.4 kg)  03/05/20  169 lb (76.7 kg)  08/28/19 166 lb (75.3 kg)    Physical Exam Vitals and nursing note reviewed.  Constitutional:      General: She is not in acute distress.    Appearance: Normal appearance. She is not ill-appearing or diaphoretic.  HENT:     Head: Normocephalic and atraumatic.     Right Ear: Tympanic membrane and external ear normal. There is no impacted cerumen.     Left Ear: External ear normal.     Nose: No congestion or rhinorrhea.     Mouth/Throat:     Pharynx: No oropharyngeal exudate or posterior oropharyngeal erythema.  Eyes:     Conjunctiva/sclera: Conjunctivae normal.     Pupils: Pupils are equal, round, and reactive to light.  Cardiovascular:     Rate and Rhythm: Normal rate and regular rhythm.     Heart sounds: No murmur heard. No friction rub. No gallop.   Pulmonary:     Effort: No respiratory distress.     Breath sounds: No stridor. No wheezing or rhonchi.  Chest:     Chest wall: No tenderness.  Abdominal:     General: Abdomen is flat. Bowel sounds are normal. There is no distension.     Palpations: Abdomen is soft.  Musculoskeletal:        General: No  swelling or deformity.     Cervical back: Normal range of motion and neck supple. No rigidity or tenderness.     Right lower leg: No edema.     Left lower leg: No edema.  Skin:    General: Skin is warm and dry.     Coloration: Skin is not jaundiced.     Findings: No erythema.  Neurological:     Mental Status: She is alert.     Results for orders placed or performed in visit on 03/05/20  WET PREP FOR TRICH, YEAST, CLUE   Specimen: Vaginal Fluid   VAGINAL FLUI  Result Value Ref Range   Trichomonas Exam Negative Negative   Yeast Exam Negative Negative   Clue Cell Exam Positive (A) Negative        Current Outpatient Medications:  .  losartan (COZAAR) 100 MG tablet, TAKE 1 TABLET (100 MG TOTAL) BY MOUTH DAILY. NEED APPOINTMENT FOR FURTHER REFILLS., Disp: 90 tablet, Rfl: 1 .  valACYclovir (VALTREX) 1000  MG tablet, Take 1 tablet (1,000 mg total) by mouth 2 (two) times daily., Disp: 20 tablet, Rfl: 4    Assessment & Plan:   HSV : uses valtrex x 10 days.  Has vaginal lesions ? Sec to HSV +ve in the past.  Is on valtrex prn for such  To fu with pcp / ob for such   HTN :  Continue current meds.  Medication compliance emphasised. pt advised to keep Bp logs. Pt verbalised understanding of the same. Pt to have a low salt diet . Exercise to reach a goal of at least 150 mins a week.  lifestyle modifications explained and pt understands importance of the above.    Problem List Items Addressed This Visit   None   Visit Diagnoses    Hypertension, unspecified type    -  Primary   Relevant Medications   losartan (COZAAR) 100 MG tablet   Other Relevant Orders   Comprehensive metabolic panel   Urinalysis, Routine w reflex microscopic   HSV-1 infection       Relevant Medications   valACYclovir (VALTREX) 1000 MG tablet       Follow up plan: No follow-ups on file.

## 2021-05-26 NOTE — Telephone Encounter (Signed)
Has appt with you today

## 2021-05-26 NOTE — Telephone Encounter (Signed)
Scheduled today

## 2021-05-27 LAB — COMPREHENSIVE METABOLIC PANEL
ALT: 9 IU/L (ref 0–32)
AST: 15 IU/L (ref 0–40)
Albumin/Globulin Ratio: 2 (ref 1.2–2.2)
Albumin: 4.6 g/dL (ref 3.8–4.8)
Alkaline Phosphatase: 104 IU/L (ref 44–121)
BUN/Creatinine Ratio: 10 (ref 9–23)
BUN: 9 mg/dL (ref 6–20)
Bilirubin Total: 1 mg/dL (ref 0.0–1.2)
CO2: 24 mmol/L (ref 20–29)
Calcium: 9.4 mg/dL (ref 8.7–10.2)
Chloride: 100 mmol/L (ref 96–106)
Creatinine, Ser: 0.94 mg/dL (ref 0.57–1.00)
Globulin, Total: 2.3 g/dL (ref 1.5–4.5)
Glucose: 83 mg/dL (ref 65–99)
Potassium: 4 mmol/L (ref 3.5–5.2)
Sodium: 137 mmol/L (ref 134–144)
Total Protein: 6.9 g/dL (ref 6.0–8.5)
eGFR: 81 mL/min/{1.73_m2} (ref >59)

## 2021-07-14 ENCOUNTER — Emergency Department: Payer: 59

## 2021-07-14 ENCOUNTER — Ambulatory Visit: Payer: Self-pay | Admitting: *Deleted

## 2021-07-14 ENCOUNTER — Emergency Department
Admission: EM | Admit: 2021-07-14 | Discharge: 2021-07-14 | Disposition: A | Discharge status: Home / Self Care | Payer: 59 | Attending: Emergency Medicine | Admitting: Emergency Medicine

## 2021-07-14 ENCOUNTER — Encounter: Payer: Self-pay | Admitting: Emergency Medicine

## 2021-07-14 ENCOUNTER — Other Ambulatory Visit: Payer: Self-pay

## 2021-07-14 DIAGNOSIS — Z79899 Other long term (current) drug therapy: Secondary | ICD-10-CM | POA: Insufficient documentation

## 2021-07-14 DIAGNOSIS — M25551 Pain in right hip: Secondary | ICD-10-CM | POA: Diagnosis not present

## 2021-07-14 DIAGNOSIS — F17210 Nicotine dependence, cigarettes, uncomplicated: Secondary | ICD-10-CM | POA: Insufficient documentation

## 2021-07-14 DIAGNOSIS — R102 Pelvic and perineal pain: Secondary | ICD-10-CM | POA: Insufficient documentation

## 2021-07-14 DIAGNOSIS — I10 Essential (primary) hypertension: Secondary | ICD-10-CM | POA: Insufficient documentation

## 2021-07-14 DIAGNOSIS — M25552 Pain in left hip: Secondary | ICD-10-CM | POA: Diagnosis not present

## 2021-07-14 DIAGNOSIS — M461 Sacroiliitis, not elsewhere classified: Secondary | ICD-10-CM

## 2021-07-14 LAB — URINALYSIS, COMPLETE (UACMP) WITH MICROSCOPIC
Bilirubin Urine: NEGATIVE
Glucose, UA: NEGATIVE mg/dL
Hgb urine dipstick: NEGATIVE
Ketones, ur: NEGATIVE mg/dL
Nitrite: NEGATIVE
Protein, ur: NEGATIVE mg/dL
Specific Gravity, Urine: 1.016 (ref 1.005–1.030)
pH: 8 (ref 5.0–8.0)

## 2021-07-14 LAB — COMPREHENSIVE METABOLIC PANEL
ALT: 11 U/L (ref 0–44)
AST: 15 U/L (ref 15–41)
Albumin: 3.9 g/dL (ref 3.5–5.0)
Alkaline Phosphatase: 81 U/L (ref 38–126)
Anion gap: 7 (ref 5–15)
BUN: 7 mg/dL (ref 6–20)
CO2: 25 mmol/L (ref 22–32)
Calcium: 9 mg/dL (ref 8.9–10.3)
Chloride: 104 mmol/L (ref 98–111)
Creatinine, Ser: 0.68 mg/dL (ref 0.44–1.00)
GFR, Estimated: >60 mL/min (ref >60)
Glucose, Bld: 98 mg/dL (ref 70–99)
Potassium: 3.9 mmol/L (ref 3.5–5.1)
Sodium: 136 mmol/L (ref 135–145)
Total Bilirubin: 1 mg/dL (ref 0.3–1.2)
Total Protein: 6.9 g/dL (ref 6.5–8.1)

## 2021-07-14 LAB — CBC
HCT: 51.4 % — ABNORMAL HIGH (ref 36.0–46.0)
Hemoglobin: 17.9 g/dL — ABNORMAL HIGH (ref 12.0–15.0)
MCH: 35.2 pg — ABNORMAL HIGH (ref 26.0–34.0)
MCHC: 34.8 g/dL (ref 30.0–36.0)
MCV: 101 fL — ABNORMAL HIGH (ref 80.0–100.0)
Platelets: 275 10*3/uL (ref 150–400)
RBC: 5.09 MIL/uL (ref 3.87–5.11)
RDW: 13 % (ref 11.5–15.5)
WBC: 9.8 10*3/uL (ref 4.0–10.5)
nRBC: 0 % (ref 0.0–0.2)

## 2021-07-14 LAB — POC URINE PREG, ED: Preg Test, Ur: NEGATIVE

## 2021-07-14 MED ORDER — KETOROLAC TROMETHAMINE 30 MG/ML IJ SOLN
30.0000 mg | Freq: Once | INTRAMUSCULAR | Status: AC
  Administered 2021-07-14: 30 mg via INTRAMUSCULAR
  Filled 2021-07-14: qty 1

## 2021-07-14 MED ORDER — IOHEXOL 350 MG/ML SOLN
75.0000 mL | Freq: Once | INTRAVENOUS | Status: AC | PRN
  Administered 2021-07-14: 75 mL via INTRAVENOUS
  Filled 2021-07-14: qty 75

## 2021-07-14 MED ORDER — PREDNISONE 10 MG (21) PO TBPK
ORAL_TABLET | ORAL | 0 refills | Status: DC
Start: 2021-07-14 — End: 2021-09-23

## 2021-07-14 NOTE — ED Notes (Signed)
See triage note  Presents with pain to left anterior hip area  States pain radiates into groin area  Has had similar pain in the past  This episode started 1-2 days ago  States she is not able to bear full wt  Denies any known injury

## 2021-07-14 NOTE — Telephone Encounter (Signed)
Patient is calling to report that she started having lower back pain and leg pain-R  6 months ago. She assumed it was sciatica/nerve pain and was still able to function. Patient states about 1 week ago the pain changed and she felt she was having bone pain in her pelvic region. Patient states yesterday at work- her pain got worse and today she is unable to walk- she is using crutches to get around at home. Patient states the pain is severe if she moves. Patient advised ED per pain level protocol- unable to walk.

## 2021-07-14 NOTE — ED Provider Notes (Signed)
Medical screening examination/treatment/procedure(s) were conducted as a shared visit with non-physician practitioner(s) and myself.  I personally evaluated the patient during the encounter.    ----------------------------------------- 7:20 PM on 07/14/2021 ----------------------------------------- Updated patient on results.  Right now her pain is very minimal she reports almost 0 pain when sitting still.  Fully awake and alert vital signs normal.  She does however endorse when she goes to stand discomfort this rather sharp runs across the front of her pelvic region and refers discomfort to her right anterior pelvic bone.  We discussed that my impression based on her reassuring evaluation here is that likely this is some sort of musculoskeletal or referred neurologic type pain.  Seems very reproducible with her standing and attempting to bear weight she reports the pain Micah Noel needs from the left side to the right side of the anterior pelvic region.  Denies any vaginal lesions discharge or abdominal or deep-seated pelvic discomfort  Abdomen soft nontender nondistended.  Patient fully awake and alert.  Normal distal vascular examination and neurologic exam  Discussed risk benefits and potential for close follow-up pain control, and careful return precautions.  Having discussed risk benefits and shared medical decision making, patient indicates that she would like to proceed however with additional imaging study I think this is also quite reasonable.  We will obtain CT to evaluate for further internal referred etiology of pain.  Overall impression is likely musculoskeletal, especially if her CT imaging is nonrevealing of acute etiology.  Patient agreeable with obtaining CT and if this is negative she would be certainly more than willing to follow-up with her PCP for further work-up with careful return precautions.  ----------------------------------------- 7:22 PM on  07/14/2021 ----------------------------------------- Ongoing ED care assigned to Leonides Schanz, PA, follow-up on CT abd/pelvis.    Sharyn Creamer, MD 07/14/21 Corky Crafts

## 2021-07-14 NOTE — ED Triage Notes (Signed)
Pt here with bilateral hip pain, worse on the left. Pt denies injury. PT stable in triage.

## 2021-07-14 NOTE — ED Notes (Signed)
Resting at present  Explained the wait to pt
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
## 2021-07-14 NOTE — ED Notes (Signed)
Discharge instructions reviewed with pt and spouse . Pt calm , collective .

## 2021-07-14 NOTE — Telephone Encounter (Signed)
Reason for Disposition  Can't stand (bear weight) or walk  Answer Assessment - Initial Assessment Questions 1. LOCATION and RADIATION: "Where is the pain located?"      Hip and inner R leg- started- yesterday after work pain got worse 2. QUALITY: "What does the pain feel like?"  (e.g., sharp, dull, aching, burning)     Bone pain- sharp, only with leg pressure 3. SEVERITY: "How bad is the pain?" "What does it keep you from doing?"   (Scale 1-10; or mild, moderate, severe)   -  MILD (1-3): doesn't interfere with normal activities    -  MODERATE (4-7): interferes with normal activities (e.g., work or school) or awakens from sleep, limping    -  SEVERE (8-10): excruciating pain, unable to do any normal activities, unable to walk     severe 4. ONSET: "When did the pain start?" "Does it come and go, or is it there all the time?"     Severe pain started yesterday- but she has been having pain for 6 months 5. WORK OR EXERCISE: "Has there been any recent work or exercise that involved this part of the body?"      no 6. CAUSE: "What do you think is causing the hip pain?"      Patient thought she was having sciatica  7. AGGRAVATING FACTORS: "What makes the hip pain worse?" (e.g., walking, climbing stairs, running)     moving 8. OTHER SYMPTOMS: "Do you have any other symptoms?" (e.g., back pain, pain shooting down leg,  fever, rash)     Inner leg pain- bilateral- more L leg pain  Protocols used: Hip Pain-A-AH

## 2021-07-16 LAB — URINE CULTURE

## 2021-08-31 DIAGNOSIS — Z7960 Long term (current) use of unspecified immunomodulators and immunosuppressants: Secondary | ICD-10-CM | POA: Insufficient documentation

## 2021-08-31 DIAGNOSIS — M458 Ankylosing spondylitis sacral and sacrococcygeal region: Secondary | ICD-10-CM | POA: Insufficient documentation

## 2021-09-08 ENCOUNTER — Inpatient Hospital Stay: Payer: 59 | Attending: Oncology | Admitting: Oncology

## 2021-09-08 ENCOUNTER — Inpatient Hospital Stay: Payer: 59

## 2021-09-08 ENCOUNTER — Other Ambulatory Visit: Payer: Self-pay

## 2021-09-08 ENCOUNTER — Encounter: Payer: Self-pay | Admitting: Oncology

## 2021-09-08 VITALS — BP 141/83 | HR 100 | Temp 97.8°F | Resp 20 | Wt 167.1 lb

## 2021-09-08 DIAGNOSIS — F17210 Nicotine dependence, cigarettes, uncomplicated: Secondary | ICD-10-CM | POA: Insufficient documentation

## 2021-09-08 DIAGNOSIS — D751 Secondary polycythemia: Secondary | ICD-10-CM | POA: Insufficient documentation

## 2021-09-08 DIAGNOSIS — I10 Essential (primary) hypertension: Secondary | ICD-10-CM | POA: Diagnosis not present

## 2021-09-08 DIAGNOSIS — Z79899 Other long term (current) drug therapy: Secondary | ICD-10-CM | POA: Insufficient documentation

## 2021-09-08 LAB — COMPREHENSIVE METABOLIC PANEL
ALT: 12 U/L (ref 0–44)
AST: 20 U/L (ref 15–41)
Albumin: 4.2 g/dL (ref 3.5–5.0)
Alkaline Phosphatase: 87 U/L (ref 38–126)
Anion gap: 6 (ref 5–15)
BUN: 9 mg/dL (ref 6–20)
CO2: 27 mmol/L (ref 22–32)
Calcium: 8.9 mg/dL (ref 8.9–10.3)
Chloride: 104 mmol/L (ref 98–111)
Creatinine, Ser: 0.64 mg/dL (ref 0.44–1.00)
GFR, Estimated: >60 mL/min (ref >60)
Glucose, Bld: 93 mg/dL (ref 70–99)
Potassium: 4.2 mmol/L (ref 3.5–5.1)
Sodium: 137 mmol/L (ref 135–145)
Total Bilirubin: 1 mg/dL (ref 0.3–1.2)
Total Protein: 7.6 g/dL (ref 6.5–8.1)

## 2021-09-08 LAB — CBC WITH DIFFERENTIAL/PLATELET
Abs Immature Granulocytes: 0.02 10*3/uL (ref 0.00–0.07)
Basophils Absolute: 0.1 10*3/uL (ref 0.0–0.1)
Basophils Relative: 1 %
Eosinophils Absolute: 0.2 10*3/uL (ref 0.0–0.5)
Eosinophils Relative: 3 %
HCT: 50.7 % — ABNORMAL HIGH (ref 36.0–46.0)
Hemoglobin: 17.5 g/dL — ABNORMAL HIGH (ref 12.0–15.0)
Immature Granulocytes: 0 %
Lymphocytes Relative: 24 %
Lymphs Abs: 1.6 10*3/uL (ref 0.7–4.0)
MCH: 33.9 pg (ref 26.0–34.0)
MCHC: 34.5 g/dL (ref 30.0–36.0)
MCV: 98.3 fL (ref 80.0–100.0)
Monocytes Absolute: 0.4 10*3/uL (ref 0.1–1.0)
Monocytes Relative: 6 %
Neutro Abs: 4.6 10*3/uL (ref 1.7–7.7)
Neutrophils Relative %: 66 %
Platelets: 247 10*3/uL (ref 150–400)
RBC: 5.16 MIL/uL — ABNORMAL HIGH (ref 3.87–5.11)
RDW: 12.5 % (ref 11.5–15.5)
WBC: 6.9 10*3/uL (ref 4.0–10.5)
nRBC: 0 % (ref 0.0–0.2)

## 2021-09-08 LAB — RETIC PANEL
Immature Retic Fract: 9.1 % (ref 2.3–15.9)
RBC.: 5.08 MIL/uL (ref 3.87–5.11)
Retic Count, Absolute: 90.4 10*3/uL (ref 19.0–186.0)
Retic Ct Pct: 1.8 % (ref 0.4–3.1)
Reticulocyte Hemoglobin: 38.5 pg (ref >27.9)

## 2021-09-08 NOTE — Progress Notes (Signed)
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 female who was seen in consultation at the request of Patterson Hammersmith, MD for evaluation of polycytosis/erythrocytosis Reviewed patient's recent lab work  09/01/2021 Labs showed elevated hemoglobin at 19.2  total white count9.6  platelet counts 284 Chronic Onset, duration since 2019 No aggravating or alleviating factors.   Associated signs or symptoms: Denies weight loss, fever, chills, fatigue, night sweats.   Context:  Smoking history: 4-5 cigarettes daily Denies any previous history of DVT, sleep apnea diagnosis.   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  HENT:   Negative for hearing loss and voice change.   Eyes:  Negative for eye problems.  Respiratory:  Negative for chest tightness and cough.   Cardiovascular:  Negative for chest pain.  Gastrointestinal:  Negative for abdominal distention, abdominal pain and blood in stool.  Endocrine: Negative for hot flashes.  Genitourinary:  Negative for difficulty urinating and frequency.   Musculoskeletal:        Hip pain  Skin:  Negative for itching and rash.  Neurological:  Negative for extremity weakness.  Hematological:  Negative for adenopathy.  Psychiatric/Behavioral:  Negative for confusion.    MEDICAL HISTORY:  Past Medical History:  Diagnosis Date   Cystic fibrosis gene carrier    fob is negative for gene   Hypertension    Medical history non-contributory     SURGICAL HISTORY: Past Surgical History:  Procedure Laterality Date    TUBAL LIGATION Bilateral 10/09/2013   Procedure: POST PARTUM TUBAL LIGATION;  Surgeon: Willodean Rosenthal, MD;  Location: [facility];  Service: Gynecology;  Laterality: Bilateral;    SOCIAL HISTORY: Social History   Socioeconomic History   Marital status: Single    Spouse name: Not on file   Number of children: Not on file   Years of education: Not on file   Highest education level: Not on file  Occupational History   Not on file  Tobacco Use   Smoking status: Every Day    Packs/day: 0.25    Years: 7.00    Pack years: 1.75    Types: Cigarettes   Smokeless tobacco: Never  Vaping Use   Vaping Use: Some days  Substance and Sexual Activity   Alcohol use: Yes    Alcohol/week: 2.0 standard drinks    Types: 1 Glasses of wine, 1 Cans of beer per week    Comment: Using electronic cigarette rarely at this point/ pt is social drinker when not pregnant.   Drug use: No   Sexual activity: Yes    Partners: Male    Birth control/protection: Surgical  Other Topics Concern   Not on file  Social History Narrative   Not on file   Social Determinants of Health   Financial Resource Strain: Not on file  Food Insecurity: Not on file  Transportation Needs: Not on file  Physical Activity: Not on file  Stress: Not on file  Social Connections: Not on file  Intimate Partner Violence: Not on file    FAMILY HISTORY: Family History  Problem Relation Age of Onset   Hypertension Father  ALLERGIES:  is allergic to lisinopril.  MEDICATIONS:  Current Outpatient Medications  Medication Sig Dispense Refill   losartan (COZAAR) 100 MG tablet Take 1 tablet (100 mg total) by mouth daily. Need appointment for further refills. 90 tablet 1   valACYclovir (VALTREX) 1000 MG tablet Take 1 tablet (1,000 mg total) by mouth 2 (two) times daily. 20 tablet 0   predniSONE (STERAPRED UNI-PAK 21 TAB) 10 MG (21) TBPK tablet Take 6 tablets the first day, take 5 tablets the second day, take 4 tablets the  third day, take 3 tablets the fourth day, take 2 tablets the fifth day, take 1 tablet the sixth day. (Patient not taking: Reported on 09/08/2021) 21 tablet 0   No current facility-administered medications for this visit.     PHYSICAL EXAMINATION: ECOG PERFORMANCE STATUS: 0 - Asymptomatic Vitals:   09/08/21 1501  BP: (!) 141/83  Pulse: 100  Resp: 20  Temp: 97.8 F (36.6 C)  SpO2: 100%   Filed Weights   09/08/21 1501  Weight: 167 lb 1.6 oz (75.8 kg)    Physical Exam Constitutional:      General: She is not in acute distress. HENT:     Head: Normocephalic and atraumatic.  Eyes:     General: No scleral icterus. Cardiovascular:     Rate and Rhythm: Normal rate and regular rhythm.     Heart sounds: Normal heart sounds.  Pulmonary:     Effort: Pulmonary effort is normal. No respiratory distress.     Breath sounds: No wheezing.  Abdominal:     General: Bowel sounds are normal. There is no distension.     Palpations: Abdomen is soft.  Musculoskeletal:        General: No deformity. Normal range of motion.     Cervical back: Normal range of motion and neck supple.  Skin:    General: Skin is warm and dry.     Findings: No erythema or rash.  Neurological:     Mental Status: She is alert and oriented to person, place, and time. Mental status is at baseline.     Cranial Nerves: No cranial nerve deficit.     Coordination: Coordination normal.  Psychiatric:        Mood and Affect: Mood normal.    RADIOGRAPHIC STUDIES: I have personally reviewed the radiological images as listed and agreed with the findings in the report. No results found.   LABORATORY DATA:  I have reviewed the data as listed Lab Results  Component Value Date   WBC 6.9 09/08/2021   HGB 17.5 (H) 09/08/2021   HCT 50.7 (H) 09/08/2021   MCV 98.3 09/08/2021   PLT 247 09/08/2021   Recent Labs    05/26/21 1404 07/14/21 1938 09/08/21 1547  NA 137 136 137  K 4.0 3.9 4.2  CL 100 104 104  CO2 24 25 27    GLUCOSE 83 98 93  BUN 9 7 9   CREATININE 0.94 0.68 0.64  CALCIUM 9.4 9.0 8.9  GFRNONAA  --  >60 >60  PROT 6.9 6.9 7.6  ALBUMIN 4.6 3.9 4.2  AST 15 15 20   ALT 9 11 12   ALKPHOS 104 81 87  BILITOT 1.0 1.0 1.0   Iron/TIBC/Ferritin/ %Sat No results found for: IRON, TIBC, FERRITIN, IRONPCTSAT      ASSESSMENT & PLAN:  1. Erythrocytosis    Labs are reviewed and discussed with patient. Polycythemia (erythrocytosis) is an abnormal elevation of hemoglobin (Hgb) and/or hematocrit (Hct) in peripheral blood, and this  can be caused by primary etiology, ie bone marrow mutation, or secondary etiology, ie hypoxia, smoking,  etc.  I will obtain erythropoietin, carbo monoxide level, rule out primary etiology, JAK2 with reflex to other mutations, BCR-ABL. Cbc, cmp, smear   Phlebotomy 500cc x 1 for Hct >50  Orders Placed This Encounter  Procedures   CBC with Differential/Platelet    Standing Status:   Future    Number of Occurrences:   1    Standing Expiration Date:   09/08/2022   Comprehensive metabolic panel    Standing Status:   Future    Number of Occurrences:   1    Standing Expiration Date:   09/08/2022   JAK2 V617F, w Reflex to CALR/E12/MPL    Standing Status:   Future    Number of Occurrences:   1    Standing Expiration Date:   09/08/2022   BCR-ABL1 FISH    Standing Status:   Future    Number of Occurrences:   1    Standing Expiration Date:   09/08/2022   Carbon monoxide, blood (performed at ref lab)    Standing Status:   Future    Number of Occurrences:   1    Standing Expiration Date:   09/08/2022   Erythropoietin    Standing Status:   Future    Number of Occurrences:   1    Standing Expiration Date:   09/08/2022   Retic Panel    Standing Status:   Future    Number of Occurrences:   1    Standing Expiration Date:   09/08/2022   Technologist smear review    Standing Status:   Future    Number of Occurrences:   1    Standing Expiration Date:   09/08/2022    We spent sufficient  time to discuss many aspect of care, questions were answered to patient's satisfaction. The patient knows to call the clinic with any problems questions or concerns.  Cc Allena Katz, Mayur K, MD  Return of visit: 2 weeks Thank you for this kind referral and the opportunity to participate in the care of this patient. A copy of today's note is routed to referring provider    Rickard Patience, MD, PhD 09/08/2021

## 2021-09-09 LAB — ERYTHROPOIETIN: Erythropoietin: 7 m[IU]/mL (ref 2.6–18.5)

## 2021-09-09 LAB — CARBON MONOXIDE, BLOOD (PERFORMED AT REF LAB): Carbon Monoxide, Blood: 8 % — ABNORMAL HIGH (ref 0.0–3.6)

## 2021-09-09 LAB — TECHNOLOGIST SMEAR REVIEW: Plt Morphology: NORMAL

## 2021-09-11 ENCOUNTER — Encounter: Payer: Self-pay | Admitting: Oncology

## 2021-09-11 ENCOUNTER — Inpatient Hospital Stay: Payer: 59

## 2021-09-11 VITALS — BP 148/97 | HR 95 | Temp 99.0°F | Resp 18

## 2021-09-11 DIAGNOSIS — D751 Secondary polycythemia: Secondary | ICD-10-CM

## 2021-09-11 LAB — BCR-ABL1 FISH
Cells Analyzed: 200
Cells Counted: 200

## 2021-09-11 NOTE — Patient Instructions (Signed)

## 2021-09-11 NOTE — Progress Notes (Signed)
Patient here for Therapeutic phlebotomy. This is her 1st treatment. Explained procedure and gave written discharge instructions. 500 ml of blood removed. Pt tolerated procedure well. Gave pt water and peanut butter crackers. Discharged to home feeling well.

## 2021-09-23 ENCOUNTER — Inpatient Hospital Stay: Payer: 59 | Attending: Oncology | Admitting: Oncology

## 2021-09-23 ENCOUNTER — Other Ambulatory Visit: Payer: Self-pay

## 2021-09-23 ENCOUNTER — Encounter: Payer: Self-pay | Admitting: Oncology

## 2021-09-23 DIAGNOSIS — D751 Secondary polycythemia: Secondary | ICD-10-CM

## 2021-09-23 DIAGNOSIS — Z720 Tobacco use: Secondary | ICD-10-CM

## 2021-09-23 LAB — CALR + JAK2 E12-15 + MPL (REFLEXED)

## 2021-09-23 LAB — JAK2 V617F, W REFLEX TO CALR/E12/MPL

## 2021-09-23 NOTE — Progress Notes (Signed)
HEMATOLOGY-ONCOLOGY TeleHEALTH VISIT PROGRESS NOTE  I connected with Whitney Rodriguez on 09/23/21  at  3:50 PM EDT by video enabled telemedicine visit and verified that I am speaking with the correct person using two identifiers. I discussed the limitations, risks, security and privacy concerns of performing an evaluation and management service by telemedicine and the availability of in-person appointments. The patient expressed understanding and agreed to proceed.   Other persons participating in the visit and their role in the encounter:  None  Patient's location: Home  Provider's location: office Chief Complaint: Erythrocytosis   INTERVAL HISTORY Whitney Rodriguez is a 35 y.o. female who has above history reviewed by me today presents for follow up visit for management of erythrocytosis Patient has had blood work done and presents to discuss results. During interval patient has had 1 phlebotomy session.  She tolerates well.  She is working on cutting down her smoking.  No new complaints.  Review of Systems  Constitutional:  Negative for appetite change, chills, fatigue and fever.  HENT:   Negative for hearing loss and voice change.   Eyes:  Negative for eye problems.  Respiratory:  Negative for chest tightness and cough.   Cardiovascular:  Negative for chest pain.  Gastrointestinal:  Negative for abdominal distention, abdominal pain and blood in stool.  Endocrine: Negative for hot flashes.  Genitourinary:  Negative for difficulty urinating and frequency.   Musculoskeletal:  Negative for arthralgias.  Skin:  Negative for itching and rash.  Neurological:  Negative for extremity weakness.  Hematological:  Negative for adenopathy.  Psychiatric/Behavioral:  Negative for confusion.    Past Medical History:  Diagnosis Date   Cystic fibrosis gene carrier    fob is negative for gene   Hypertension    Medical history non-contributory    Past Surgical History:  Procedure Laterality Date    TUBAL LIGATION Bilateral 10/09/2013   Procedure: POST PARTUM TUBAL LIGATION;  Surgeon: Willodean Rosenthal, MD;  Location: [facility];  Service: Gynecology;  Laterality: Bilateral;    Family History  Problem Relation Age of Onset   Hypertension Father     Social History   Socioeconomic History   Marital status: Single    Spouse name: Not on file   Number of children: Not on file   Years of education: Not on file   Highest education level: Not on file  Occupational History   Not on file  Tobacco Use   Smoking status: Every Day    Packs/day: 0.25    Years: 7.00    Pack years: 1.75    Types: Cigarettes   Smokeless tobacco: Never  Vaping Use   Vaping Use: Some days  Substance and Sexual Activity   Alcohol use: Yes    Alcohol/week: 2.0 standard drinks    Types: 1 Glasses of wine, 1 Cans of beer per week    Comment: Using electronic cigarette rarely at this point/ pt is social drinker when not pregnant.   Drug use: No   Sexual activity: Yes    Partners: Male    Birth control/protection: Surgical  Other Topics Concern   Not on file  Social History Narrative   Not on file   Social Determinants of Health   Financial Resource Strain: Not on file  Food Insecurity: Not on file  Transportation Needs: Not on file  Physical Activity: Not on file  Stress: Not on file  Social Connections: Not on file  Intimate Partner Violence: Not on file    Current  Outpatient Medications on File Prior to Visit  Medication Sig Dispense Refill   losartan (COZAAR) 100 MG tablet Take 1 tablet (100 mg total) by mouth daily. Need appointment for further refills. 90 tablet 1   valACYclovir (VALTREX) 1000 MG tablet Take 1 tablet (1,000 mg total) by mouth 2 (two) times daily. 20 tablet 0   No current facility-administered medications on file prior to visit.    Allergies  Allergen Reactions   Lisinopril Cough       Observations/Objective: There were no vitals filed for this visit. There is no  height or weight on file to calculate BMI.  Physical Exam Neurological:     Mental Status: She is alert.    CBC    Component Value Date/Time   WBC 6.9 09/08/2021 1547   RBC 5.08 09/08/2021 1547   RBC 5.16 (H) 09/08/2021 1547   HGB 17.5 (H) 09/08/2021 1547   HGB 17.2 (H) 03/30/2018 1423   HCT 50.7 (H) 09/08/2021 1547   HCT 49.5 (H) 03/30/2018 1423   PLT 247 09/08/2021 1547   PLT 230 03/30/2018 1423   MCV 98.3 09/08/2021 1547   MCV 95 03/30/2018 1423   MCH 33.9 09/08/2021 1547   MCHC 34.5 09/08/2021 1547   RDW 12.5 09/08/2021 1547   RDW 13.4 03/30/2018 1423   LYMPHSABS 1.6 09/08/2021 1547   LYMPHSABS 1.5 03/30/2018 1423   MONOABS 0.4 09/08/2021 1547   EOSABS 0.2 09/08/2021 1547   EOSABS 0.1 03/30/2018 1423   BASOSABS 0.1 09/08/2021 1547   BASOSABS 0.0 03/30/2018 1423    CMP     Component Value Date/Time   NA 137 09/08/2021 1547   NA 137 05/26/2021 1404   K 4.2 09/08/2021 1547   CL 104 09/08/2021 1547   CO2 27 09/08/2021 1547   GLUCOSE 93 09/08/2021 1547   BUN 9 09/08/2021 1547   BUN 9 05/26/2021 1404   CREATININE 0.64 09/08/2021 1547   CALCIUM 8.9 09/08/2021 1547   PROT 7.6 09/08/2021 1547   PROT 6.9 05/26/2021 1404   ALBUMIN 4.2 09/08/2021 1547   ALBUMIN 4.6 05/26/2021 1404   AST 20 09/08/2021 1547   ALT 12 09/08/2021 1547   ALKPHOS 87 09/08/2021 1547   BILITOT 1.0 09/08/2021 1547   BILITOT 1.0 05/26/2021 1404   GFRNONAA >60 09/08/2021 1547   GFRAA 104 08/28/2019 1501     Assessment and Plan: 1. Erythrocytosis   2. 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 BCR ABL 1 FISH negative. Elevated carbon monoxide level consistent with smoking induced secondary erythrocytosis. Patient has received phlebotomy session x1.  Tolerates well. Repeat H&H in 6 weeks +/- phlebotomy.  She will follow-up in 3 months for evaluation of need of  additional phlebotomy.   Tobacco use, smoke cessation was discussed with patient.  She is motivated.   Follow Up Instructions: 3 months   I discussed the assessment and treatment plan with the patient. The patient was provided an opportunity to ask questions and all were answered. The patient agreed with the plan and demonstrated an understanding of the instructions.  The patient was advised to call back or seek an in-person evaluation if the symptoms worsen or if the condition fails to improve as anticipated.  Rickard Patience, MD 09/23/2021 10:31 PM

## 2021-09-23 NOTE — Progress Notes (Signed)
Patient contacted for Mychart visit. No new concerns voiced.  

## 2021-11-05 ENCOUNTER — Inpatient Hospital Stay: Payer: 59

## 2021-11-05 ENCOUNTER — Inpatient Hospital Stay: Payer: 59 | Attending: Oncology

## 2021-11-05 ENCOUNTER — Other Ambulatory Visit: Payer: Self-pay

## 2021-11-05 DIAGNOSIS — D751 Secondary polycythemia: Secondary | ICD-10-CM | POA: Insufficient documentation

## 2021-11-05 LAB — HEMOGLOBIN AND HEMATOCRIT, BLOOD
HCT: 49.2 % — ABNORMAL HIGH (ref 36.0–46.0)
Hemoglobin: 16.6 g/dL — ABNORMAL HIGH (ref 12.0–15.0)

## 2021-11-05 NOTE — Progress Notes (Signed)
HCT < 50 today. Per MD parameters no phlebotomy required today. Pt made aware of results. RTC on 12/31/20

## 2021-11-20 ENCOUNTER — Other Ambulatory Visit: Payer: Self-pay | Admitting: Internal Medicine

## 2021-12-15 ENCOUNTER — Ambulatory Visit: Payer: Self-pay

## 2021-12-15 ENCOUNTER — Telehealth: Payer: Self-pay | Admitting: Nurse Practitioner

## 2021-12-15 ENCOUNTER — Encounter: Payer: Self-pay | Admitting: Nurse Practitioner

## 2021-12-15 NOTE — Telephone Encounter (Signed)
ERROR

## 2021-12-15 NOTE — Telephone Encounter (Deleted)
Medication Refill - Medication: valACYclovir (VALTREX) 1000 MG tablet  Has the patient contacted their pharmacy? No. Stated has no more refills.  (Agent: If no, request that the patient contact the pharmacy for the refill. If patient does not wish to contact the pharmacy document the reason why and proceed with request.)  Preferred Pharmacy (with phone number or street name):   [facility] [phone] [address]  [address] [place] [address]  Phone: [phone] Fax: [phone]  Hours: Not open 24 hours    Has the patient been seen for an appointment in the last year OR does the patient have an upcoming appointment? Yes.   Pt made an appointment for medication refills. Appointment is 12/21/2021 @ 9:40 AM.  Agent: Please be advised that RX refills may take up to 3 business days. We ask that you follow-up with your pharmacy.

## 2021-12-15 NOTE — Telephone Encounter (Deleted)
P 

## 2021-12-15 NOTE — Telephone Encounter (Signed)
Patient called, left VM to return the call to the office to discuss symptoms with a nurse.   Summary: Outbreak private area//Medication refill   Canceled appointment for 12/21/2021 Epic allowed me to schedule on a day we are closed.   ----- Message from Candee Furbish sent at 12/15/2021 12:27 PM EST -----  Pt stating she is experiencing an outbreak in her private area. Pt requested medication refill for valACYclovir (VALTREX) 1000 MG tablet.( Submitted telephone encounter for this as well by accident) Pt made an appointment for medication refills. Appointment is 12/21/2021 @ 9:40 AM.  Seeking clinical advice.

## 2021-12-15 NOTE — Telephone Encounter (Signed)
°  Chief Complaint: herpes flare up  Symptoms: genital bumps Frequency: 1 day Pertinent Negatives: Patient denies no other symptoms Disposition: [] ED /[] Urgent Care (no appt availability in office) / [x] Appointment(In office/virtual)/ []  [facility]/ [] Home Care/ [] Refused Recommended Disposition  Additional Notes: pt is asking for refill on Valtrex but also scheduled for appt on 12/17/21 at 1600. If med can be refilled prior to visit pt [facility] [place] [facility].    Reason for Disposition  [1] Rash (e.g., redness, tiny bumps, sore) of genital area AND [2] present > 24 hours  Answer Assessment - Initial Assessment Questions 1. SYMPTOM: "What's the main symptom you're concerned about?" (e.g., rash, itching, swelling, dryness)     Herpes outbreak 2. LOCATION: "Where is the  sx located?" (e.g., inside/outside, left/right)     Outside  3. ONSET: "When did the  sx  start?"     Yesterday  4. PAIN: "Is there any pain?" If Yes, ask: "How bad is it?" (Scale: 1-10; mild, moderate, severe)   -  MILD (1-3): doesn't interfere with normal activities    -  MODERATE (4-7): interferes with normal activities (e.g., work or school) or awakens from sleep     -  SEVERE (8-10): excruciating pain, unable to do any normal activities     3-4 5. CAUSE: "What do you think is causing the symptoms?"     Out of valtrex  6. OTHER SYMPTOMS: "Do you have any other symptoms?" (e.g., fever, vaginal bleeding, pain with urination)     No  Protocols used: Vulvar Symptoms-A-AH

## 2021-12-16 NOTE — Addendum Note (Signed)
Addended by: Pablo Ledger on: 12/16/2021 11:34 AM   Modules accepted: Orders

## 2021-12-16 NOTE — Telephone Encounter (Signed)
I have not seen this patient before and she did not follow in September as requested during her office visit in June.

## 2021-12-16 NOTE — Telephone Encounter (Signed)
Routing to provider for refill if possible.  

## 2021-12-17 ENCOUNTER — Encounter: Payer: Self-pay | Admitting: Nurse Practitioner

## 2021-12-17 ENCOUNTER — Telehealth (INDEPENDENT_AMBULATORY_CARE_PROVIDER_SITE_OTHER): Payer: 59 | Admitting: Nurse Practitioner

## 2021-12-17 DIAGNOSIS — B009 Herpesviral infection, unspecified: Secondary | ICD-10-CM | POA: Diagnosis not present

## 2021-12-17 MED ORDER — VALACYCLOVIR HCL 1 G PO TABS: 1000.0000 mg | ORAL_TABLET | Freq: Two times a day (BID) | ORAL | 0 refills | Status: DC

## 2021-12-17 NOTE — Progress Notes (Signed)
Acute Office Visit  Subjective:    Patient ID: Whitney Rodriguez, female    DOB: [birth year], 35 y.o.   MRN: [id number]  Chief Complaint  Patient presents with   Herpes Zoster    Patient states she is currently having a Herpes flare up. Patient states she first noticed the flare up Monday.     HPI Patient is in today for herpes flare up that started on Monday. It started vaginal pain and she had 1 valtrex left from a prior prescription that she took Monday. Since then, the pain has resolved but she has blisters on the outside of her labia. She has only had 3 flares since her diagnosis in 2019.   Past Medical History:  Diagnosis Date   Cystic fibrosis gene carrier    fob is negative for gene   Herpes    Hypertension    Medical history non-contributory     Past Surgical History:  Procedure Laterality Date   TUBAL LIGATION Bilateral 10/09/2013   Procedure: POST PARTUM TUBAL LIGATION;  Surgeon: Willodean Rosenthal, MD;  Location: [facility];  Service: Gynecology;  Laterality: Bilateral;    Family History  Problem Relation Age of Onset   Hypertension Father     Social History   Socioeconomic History   Marital status: Single    Spouse name: Not on file   Number of children: Not on file   Years of education: Not on file   Highest education level: Not on file  Occupational History   Not on file  Tobacco Use   Smoking status: Every Day    Packs/day: 0.25    Years: 7.00    Pack years: 1.75    Types: Cigarettes   Smokeless tobacco: Never  Vaping Use   Vaping Use: Some days  Substance and Sexual Activity   Alcohol use: Yes    Alcohol/week: 2.0 standard drinks    Types: 1 Glasses of wine, 1 Cans of beer per week    Comment: Using electronic cigarette rarely at this point/ pt is social drinker when not pregnant.   Drug use: No   Sexual activity: Yes    Partners: Male    Birth control/protection: Surgical  Other Topics Concern   Not on file  Social History Narrative    Not on file   Social Determinants of Health   Financial Resource Strain: Not on file  Food Insecurity: Not on file  Transportation Needs: Not on file  Physical Activity: Not on file  Stress: Not on file  Social Connections: Not on file  Intimate Partner Violence: Not on file    Outpatient Medications Prior to Visit  Medication Sig Dispense Refill   losartan (COZAAR) 100 MG tablet Take 1 tablet (100 mg total) by mouth daily. Need appointment for further refills. 90 tablet 1   valACYclovir (VALTREX) 1000 MG tablet Take 1 tablet (1,000 mg total) by mouth 2 (two) times daily. 20 tablet 0   No facility-administered medications prior to visit.    Allergies  Allergen Reactions   Lisinopril Cough    Review of Systems  Constitutional: Negative.   Respiratory: Negative.    Cardiovascular: Negative.   Gastrointestinal: Negative.   Genitourinary:  Positive for genital sores.      Objective:    Physical Exam Vitals and nursing note reviewed.  Constitutional:      General: She is not in acute distress.    Appearance: Normal appearance.  HENT:     Head: Normocephalic.  Eyes:     Conjunctiva/sclera: Conjunctivae normal.  Pulmonary:     Effort: Pulmonary effort is normal.     Comments: Able to talk in complete sentences  Neurological:     Mental Status: She is alert and oriented to person, place, and time.  Psychiatric:        Mood and Affect: Mood normal.        Behavior: Behavior normal.        Thought Content: Thought content normal.        Judgment: Judgment normal.    There were no vitals taken for this visit. Wt Readings from Last 3 Encounters:  09/08/21 167 lb 1.6 oz (75.8 kg)  07/14/21 168 lb 6.9 oz (76.4 kg)  05/26/21 168 lb 6.4 oz (76.4 kg)    Health Maintenance Due  Topic Date Due   COVID-19 Vaccine (1) Never done   PAP SMEAR-Modifier  02/16/2021   INFLUENZA VACCINE  07/20/2021    There are no preventive care reminders to display for this  patient.   Lab Results  Component Value Date   TSH 0.537 02/16/2018   Lab Results  Component Value Date   WBC 6.9 09/08/2021   HGB 16.6 (H) 11/05/2021   HCT 49.2 (H) 11/05/2021   MCV 98.3 09/08/2021   PLT 247 09/08/2021   Lab Results  Component Value Date   NA 137 09/08/2021   K 4.2 09/08/2021   CO2 27 09/08/2021   GLUCOSE 93 09/08/2021   BUN 9 09/08/2021   CREATININE 0.64 09/08/2021   BILITOT 1.0 09/08/2021   ALKPHOS 87 09/08/2021   AST 20 09/08/2021   ALT 12 09/08/2021   PROT 7.6 09/08/2021   ALBUMIN 4.2 09/08/2021   CALCIUM 8.9 09/08/2021   ANIONGAP 6 09/08/2021   EGFR 81 05/26/2021   Lab Results  Component Value Date   CHOL 143 08/28/2019   Lab Results  Component Value Date   HDL 40 08/28/2019   Lab Results  Component Value Date   LDLCALC 74 08/28/2019   Lab Results  Component Value Date   TRIG 173 (H) 08/28/2019   No results found for: CHOLHDL No results found for: HGBA1C     Assessment & Plan:   Problem List Items Addressed This Visit       Other   HSV-1 infection - Primary    Having acute flare, will send in valtrex 1,$RemoveBefo'000mg'LJVBvHhvIlh$  to take for 7 days. She has only had 2-3 flares in the last few years, does not need suppressive therapy at this point. Follow up if symptoms don't improve or worsen.       Relevant Medications   valACYclovir (VALTREX) 1000 MG tablet     Meds ordered this encounter  Medications   valACYclovir (VALTREX) 1000 MG tablet    Sig: Take 1 tablet (1,000 mg total) by mouth 2 (two) times daily.    Dispense:  20 tablet    Refill:  0    This visit was completed via MyChart due to the restrictions of the COVID-19 pandemic. All issues as above were discussed and addressed. Physical exam was done as above through visual confirmation on MyChart. If it was felt that the patient should be evaluated in the office, they were directed there. The patient verbally consented to this visit. Location of the patient: work Location of the  provider: work Those involved with this call:  Provider: Vance Peper, NP CMA:  Irena Reichmann, [place] Desk/Registration: Myrlene Broker  Time spent  on call:  10 minutes with patient face to face via video conference. More than 50% of this time was spent in counseling and coordination of care. 10 minutes total spent in review of patient's record and preparation of their chart.   Charyl Dancer, NP

## 2021-12-17 NOTE — Assessment & Plan Note (Signed)
Having acute flare, will send in valtrex 1,000mg  to take for 7 days. She has only had 2-3 flares in the last few years, does not need suppressive therapy at this point. Follow up if symptoms don't improve or worsen.

## 2021-12-21 ENCOUNTER — Ambulatory Visit: Payer: 59 | Admitting: Nurse Practitioner

## 2021-12-31 ENCOUNTER — Inpatient Hospital Stay: Payer: 59 | Attending: Nurse Practitioner

## 2021-12-31 ENCOUNTER — Ambulatory Visit: Payer: 59 | Admitting: Oncology

## 2021-12-31 ENCOUNTER — Inpatient Hospital Stay: Payer: 59

## 2021-12-31 ENCOUNTER — Inpatient Hospital Stay (HOSPITAL_BASED_OUTPATIENT_CLINIC_OR_DEPARTMENT_OTHER): Payer: 59 | Admitting: Nurse Practitioner

## 2021-12-31 ENCOUNTER — Encounter: Payer: Self-pay | Admitting: Nurse Practitioner

## 2021-12-31 ENCOUNTER — Other Ambulatory Visit: Payer: Self-pay

## 2021-12-31 VITALS — BP 156/120 | HR 79 | Temp 97.8°F | Resp 20 | Wt 173.3 lb

## 2021-12-31 DIAGNOSIS — F17210 Nicotine dependence, cigarettes, uncomplicated: Secondary | ICD-10-CM | POA: Diagnosis not present

## 2021-12-31 DIAGNOSIS — D751 Secondary polycythemia: Secondary | ICD-10-CM

## 2021-12-31 DIAGNOSIS — Z79899 Other long term (current) drug therapy: Secondary | ICD-10-CM | POA: Insufficient documentation

## 2021-12-31 LAB — CBC WITH DIFFERENTIAL/PLATELET
Abs Immature Granulocytes: 0.04 10*3/uL (ref 0.00–0.07)
Basophils Absolute: 0 10*3/uL (ref 0.0–0.1)
Basophils Relative: 1 %
Eosinophils Absolute: 0.2 10*3/uL (ref 0.0–0.5)
Eosinophils Relative: 3 %
HCT: 47.2 % — ABNORMAL HIGH (ref 36.0–46.0)
Hemoglobin: 16.1 g/dL — ABNORMAL HIGH (ref 12.0–15.0)
Immature Granulocytes: 1 %
Lymphocytes Relative: 25 %
Lymphs Abs: 2 10*3/uL (ref 0.7–4.0)
MCH: 34.3 pg — ABNORMAL HIGH (ref 26.0–34.0)
MCHC: 34.1 g/dL (ref 30.0–36.0)
MCV: 100.4 fL — ABNORMAL HIGH (ref 80.0–100.0)
Monocytes Absolute: 0.4 10*3/uL (ref 0.1–1.0)
Monocytes Relative: 5 %
Neutro Abs: 5.4 10*3/uL (ref 1.7–7.7)
Neutrophils Relative %: 65 %
Platelets: 261 10*3/uL (ref 150–400)
RBC: 4.7 MIL/uL (ref 3.87–5.11)
RDW: 13 % (ref 11.5–15.5)
WBC: 8.1 10*3/uL (ref 4.0–10.5)
nRBC: 0 % (ref 0.0–0.2)

## 2021-12-31 NOTE — Progress Notes (Signed)
HEMATOLOGY-ONCOLOGY  PROGRESS NOTE   Chief Complaint: Erythrocytosis  INTERVAL HISTORY Whitney Rodriguez is a 36 y.o. female with history of erythrocytosis secondary to smoking who returns to clinic for labs and follow up. She has only required one phlebotomy thus far. Has been cutting back on her smoking. Feels well and no new complaints.   Review of Systems  Constitutional:  Negative for appetite change, chills, fatigue and fever.  HENT:   Negative for hearing loss and voice change.   Eyes:  Negative for eye problems.  Respiratory:  Negative for chest tightness and cough.   Cardiovascular:  Negative for chest pain.  Gastrointestinal:  Negative for abdominal distention, abdominal pain and blood in stool.  Endocrine: Negative for hot flashes.  Genitourinary:  Negative for difficulty urinating and frequency.   Musculoskeletal:  Negative for arthralgias.  Skin:  Negative for itching and rash.  Neurological:  Negative for extremity weakness.  Hematological:  Negative for adenopathy.  Psychiatric/Behavioral:  Negative for confusion.     Past Medical History:  Diagnosis Date   Cystic fibrosis gene carrier    fob is negative for gene   Herpes    Hypertension    Medical history non-contributory    Past Surgical History:  Procedure Laterality Date   TUBAL LIGATION Bilateral 10/09/2013   Procedure: POST PARTUM TUBAL LIGATION;  Surgeon: Willodean Rosenthal, MD;  Location: [facility];  Service: Gynecology;  Laterality: Bilateral;    Family History  Problem Relation Age of Onset   Hypertension Father     Social History   Socioeconomic History   Marital status: Single    Spouse name: Not on file   Number of children: Not on file   Years of education: Not on file   Highest education level: Not on file  Occupational History   Not on file  Tobacco Use   Smoking status: Every Day    Packs/day: 0.25    Years: 7.00    Pack years: 1.75    Types: Cigarettes   Smokeless tobacco: Never   Vaping Use   Vaping Use: Some days  Substance and Sexual Activity   Alcohol use: Yes    Alcohol/week: 2.0 standard drinks    Types: 1 Glasses of wine, 1 Cans of beer per week    Comment: Using electronic cigarette rarely at this point/ pt is social drinker when not pregnant.   Drug use: No   Sexual activity: Yes    Partners: Male    Birth control/protection: Surgical  Other Topics Concern   Not on file  Social History Narrative   Not on file   Social Determinants of Health   Financial Resource Strain: Not on file  Food Insecurity: Not on file  Transportation Needs: Not on file  Physical Activity: Not on file  Stress: Not on file  Social Connections: Not on file  Intimate Partner Violence: Not on file    Current Outpatient Medications on File Prior to Visit  Medication Sig Dispense Refill   losartan (COZAAR) 100 MG tablet Take 1 tablet (100 mg total) by mouth daily. Need appointment for further refills. 90 tablet 1   valACYclovir (VALTREX) 1000 MG tablet Take 1 tablet (1,000 mg total) by mouth 2 (two) times daily. 20 tablet 0   No current facility-administered medications on file prior to visit.    Allergies  Allergen Reactions   Lisinopril Cough       Observations/Objective: Today's Vitals   12/31/21 1402  BP: (!) 156/120  Pulse: 79  Resp: 20  Temp: 97.8 F (36.6 C)  SpO2: 100%  Weight: 173 lb 4.8 oz (78.6 kg)   Body mass index is 31.44 kg/m.  Physical Exam Constitutional:      Appearance: She is not ill-appearing.  Cardiovascular:     Rate and Rhythm: Normal rate and regular rhythm.  Pulmonary:     Effort: No respiratory distress.  Skin:    Coloration: Skin is not pale.  Neurological:     Mental Status: She is alert and oriented to person, place, and time.  Psychiatric:        Mood and Affect: Mood normal.        Behavior: Behavior normal.    CBC    Component Value Date/Time   WBC 8.1 12/31/2021 1347   RBC 4.70 12/31/2021 1347   HGB 16.1 (H)  12/31/2021 1347   HGB 17.2 (H) 03/30/2018 1423   HCT 47.2 (H) 12/31/2021 1347   HCT 49.5 (H) 03/30/2018 1423   PLT 261 12/31/2021 1347   PLT 230 03/30/2018 1423   MCV 100.4 (H) 12/31/2021 1347   MCV 95 03/30/2018 1423   MCH 34.3 (H) 12/31/2021 1347   MCHC 34.1 12/31/2021 1347   RDW 13.0 12/31/2021 1347   RDW 13.4 03/30/2018 1423   LYMPHSABS 2.0 12/31/2021 1347   LYMPHSABS 1.5 03/30/2018 1423   MONOABS 0.4 12/31/2021 1347   EOSABS 0.2 12/31/2021 1347   EOSABS 0.1 03/30/2018 1423   BASOSABS 0.0 12/31/2021 1347   BASOSABS 0.0 03/30/2018 1423    CMP     Component Value Date/Time   NA 137 09/08/2021 1547   NA 137 05/26/2021 1404   K 4.2 09/08/2021 1547   CL 104 09/08/2021 1547   CO2 27 09/08/2021 1547   GLUCOSE 93 09/08/2021 1547   BUN 9 09/08/2021 1547   BUN 9 05/26/2021 1404   CREATININE 0.64 09/08/2021 1547   CALCIUM 8.9 09/08/2021 1547   PROT 7.6 09/08/2021 1547   PROT 6.9 05/26/2021 1404   ALBUMIN 4.2 09/08/2021 1547   ALBUMIN 4.6 05/26/2021 1404   AST 20 09/08/2021 1547   ALT 12 09/08/2021 1547   ALKPHOS 87 09/08/2021 1547   BILITOT 1.0 09/08/2021 1547   BILITOT 1.0 05/26/2021 1404   GFRNONAA >60 09/08/2021 1547   GFRAA 104 08/28/2019 1501     Assessment and Plan: 1. 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 JAK2 V617F mutation negative, with reflex to other mutations CALR, MPL, JAK 2 Ex 12-15 mutations negative. BCR ABL 1 FISH negative. Elevated carbon monoxide level consistent with smoking induced secondary erythrocytosis. Phlebotomy if hct > 50. Hct 47.2 today.  Tobacco use- smoking cessation reviewed. Applauded her efforts to cut down on smoking. She has quit on her own several times in the past.   Follow Up Instructions: 8 weeks- H&H +/- phlebotomy 16 weeks- cbc, Dr Cathie Hoops, +/- phlebotomy  I discussed the assessment and treatment plan with the patient. The patient was provided an opportunity to ask questions and all were  answered. The patient agreed with the plan and demonstrated an understanding of the instructions.   The patient was advised to call back or seek an in-person evaluation if the symptoms worsen or if the condition fails to improve as anticipated.   Alinda Dooms, NP 12/31/2021

## 2022-02-18 ENCOUNTER — Other Ambulatory Visit: Payer: Self-pay | Admitting: *Deleted

## 2022-02-18 DIAGNOSIS — D751 Secondary polycythemia: Secondary | ICD-10-CM

## 2022-02-26 ENCOUNTER — Other Ambulatory Visit: Payer: 59

## 2022-03-01 ENCOUNTER — Encounter: Payer: Self-pay | Admitting: Internal Medicine

## 2022-03-01 ENCOUNTER — Other Ambulatory Visit: Payer: Self-pay

## 2022-03-01 ENCOUNTER — Ambulatory Visit (INDEPENDENT_AMBULATORY_CARE_PROVIDER_SITE_OTHER): Payer: 59 | Admitting: Internal Medicine

## 2022-03-01 VITALS — BP 141/94 | HR 88 | Temp 99.9°F | Ht 62.24 in | Wt 166.2 lb

## 2022-03-01 DIAGNOSIS — A6000 Herpesviral infection of urogenital system, unspecified: Secondary | ICD-10-CM | POA: Diagnosis not present

## 2022-03-01 MED ORDER — LOSARTAN POTASSIUM 100 MG PO TABS: 100.0000 mg | ORAL_TABLET | Freq: Every day | ORAL | 1 refills | Status: DC

## 2022-03-01 MED ORDER — VALACYCLOVIR HCL 1 G PO TABS: 1000.0000 mg | ORAL_TABLET | Freq: Two times a day (BID) | ORAL | 0 refills | Status: DC

## 2022-03-01 NOTE — Progress Notes (Signed)
? ?BP (!) 141/94   Pulse 88   Temp 99.9 ?F (37.7 ?C) (Oral)   Ht 5' 2.24" (1.581 m)   Wt 166 lb 3.2 oz (75.4 kg)   SpO2 99%   BMI 30.16 kg/m?   ? ?Subjective:  ? ? Patient ID: Whitney Rodriguez, female    DOB: [birth year], 36 y.o.   MRN: [id number] ? ?Chief Complaint  ?Patient presents with  ?? Hypertension  ?? Medication Refill  ?  Would like refill of Valtrex  ? ? ?HPI: ?Whitney Rodriguez is a 36 y.o. female ? ?Hypertension ?This is a chronic problem. The current episode started more than 1 year ago. The problem has been gradually worsening since onset. The problem is controlled. Pertinent negatives include no anxiety, blurred vision, chest pain, headaches, malaise/fatigue, neck pain, orthopnea, palpitations, peripheral edema, PND, shortness of breath or sweats.  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 Pertinent negatives include no chest pain, headaches or neck pain.  ? ?Chief Complaint  ?Patient presents with  ?? Hypertension  ?? Medication Refill  ?  Would like refill of Valtrex  ? ? ?Relevant past medical, surgical, family and social history reviewed and updated as indicated. Interim medical history since our last visit reviewed. ?Allergies and medications reviewed and updated. ? ?Review of Systems  ?Constitutional:  Negative for malaise/fatigue.  ?Eyes:  Negative for blurred vision.  ?Respiratory:  Negative for shortness of breath.   ?Cardiovascular:  Negative for chest pain, palpitations, orthopnea and PND.  ?Musculoskeletal:  Negative for neck pain.  ?Neurological:  Negative for headaches.  ? ?Per HPI unless specifically indicated above ? ?   ?Objective:  ?  ?BP (!) 141/94   Pulse 88   Temp 99.9 ?F (37.7 ?C) (Oral)   Ht 5' 2.24" (1.581 m)   Wt 166 lb 3.2 oz (75.4 kg)   SpO2 99%   BMI 30.16 kg/m?   ?Wt Readings from Last 3 Encounters:  ?03/01/22 166 lb 3.2 oz (75.4 kg)  ?12/31/21 173 lb 4.8 oz (78.6  kg)  ?09/08/21 167 lb 1.6 oz (75.8 kg)  ?  ?Physical Exam ?Vitals and nursing note reviewed.  ?Constitutional:   ?   General: She is not in acute distress. ?   Appearance: Normal appearance. She is not ill-appearing or diaphoretic.  ?Eyes:  ?   Conjunctiva/sclera: Conjunctivae normal.  ?Cardiovascular:  ?   Rate and Rhythm: Normal rate and regular rhythm.  ?Pulmonary:  ?   Breath sounds: No rhonchi.  ?Abdominal:  ?   General: Abdomen is flat. Bowel sounds are normal. There is no distension.  ?   Palpations: Abdomen is soft. There is no mass.  ?   Tenderness: There is no abdominal tenderness. There is no guarding.  ?Skin: ?   General: Skin is warm and dry.  ?   Coloration: Skin is not jaundiced.  ?   Findings: No erythema.  ?Neurological:  ?   Mental Status: She is alert.  ? ? ?Results for orders placed or performed in visit on 12/31/21  ?CBC with Differential/Platelet  ?Result Value Ref Range  ? WBC 8.1 4.0 - 10.5 K/uL  ? RBC 4.70 3.87 - 5.11 MIL/uL  ? Hemoglobin 16.1 (H) 12.0 - 15.0 g/dL  ? HCT 47.2 (H) 36.0 - 46.0 %  ? MCV 100.4 (H) 80.0 - 100.0 fL  ? MCH 34.3 (H) 26.0 - 34.0 pg  ?  MCHC 34.1 30.0 - 36.0 g/dL  ? RDW 13.0 11.5 - 15.5 %  ? Platelets 261 150 - 400 K/uL  ? nRBC 0.0 0.0 - 0.2 %  ? Neutrophils Relative % 65 %  ? Neutro Abs 5.4 1.7 - 7.7 K/uL  ? Lymphocytes Relative 25 %  ? Lymphs Abs 2.0 0.7 - 4.0 K/uL  ? Monocytes Relative 5 %  ? Monocytes Absolute 0.4 0.1 - 1.0 K/uL  ? Eosinophils Relative 3 %  ? Eosinophils Absolute 0.2 0.0 - 0.5 K/uL  ? Basophils Relative 1 %  ? Basophils Absolute 0.0 0.0 - 0.1 K/uL  ? Immature Granulocytes 1 %  ? Abs Immature Granulocytes 0.04 0.00 - 0.07 K/uL  ? ?   ? ? ?Current Outpatient Medications:  ??  losartan (COZAAR) 100 MG tablet, Take 1 tablet (100 mg total) by mouth daily. Need appointment for further refills., Disp: 90 tablet, Rfl: 1 ??  valACYclovir (VALTREX) 1000 MG tablet, Take 1 tablet (1,000 mg total) by mouth 2 (two) times daily., Disp: 20 tablet, Rfl: 0   ? ? ?Assessment & Plan:  ?Herpes  Valtrex 1000 mg bid x 10 days  ? ?HTN : is on losartan 100 mg daily. ?Continue current meds.  Medication compliance emphasised. pt advised to keep Bp logs. Pt verbalised understanding of the same. Pt to have a low salt diet . Exercise to reach a goal of at least 150 mins a week.  lifestyle modifications explained and pt understands importance of the above. ?Under good control on current regimen. Continue current regimen. Continue to monitor. Call with any concerns. Refills given. Labs drawn today. ? ? ?Problem List Items Addressed This Visit   ? ?  ? Genitourinary  ? 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 Relevant Medications  ? valACYclovir (VALTREX) 1000 MG tablet  ?  ? ?No orders of the defined types were placed in this encounter. ? ; ? ?Meds ordered this encounter  ?Medications  ?? valACYclovir (VALTREX) 1000 MG tablet  ?  Sig: Take 1 tablet (1,000 mg total) by mouth 2 (two) times daily.  ?  Dispense:  20 tablet  ?  Refill:  0  ?? losartan (COZAAR) 100 MG tablet  ?  Sig: Take 1 tablet (100 mg total) by mouth daily. Need appointment for further refills.  ?  Dispense:  90 tablet  ?  Refill:  1  ?  ? ?Follow up plan: ?Return in about 3 months (around 06/01/2022). ? ?

## 2022-03-25 ENCOUNTER — Other Ambulatory Visit: Payer: Self-pay | Admitting: Internal Medicine

## 2022-03-26 MED ORDER — VALACYCLOVIR HCL 1 G PO TABS: 1000.0000 mg | ORAL_TABLET | Freq: Two times a day (BID) | ORAL | 0 refills | Status: DC

## 2022-04-21 ENCOUNTER — Inpatient Hospital Stay: Payer: 59

## 2022-04-21 ENCOUNTER — Inpatient Hospital Stay: Payer: 59 | Admitting: Oncology

## 2022-04-26 ENCOUNTER — Other Ambulatory Visit: Payer: Self-pay | Admitting: Nurse Practitioner

## 2022-04-26 MED ORDER — VALACYCLOVIR HCL 1 G PO TABS: 1000.0000 mg | ORAL_TABLET | Freq: Two times a day (BID) | ORAL | 0 refills | Status: DC

## 2022-05-11 ENCOUNTER — Other Ambulatory Visit: Payer: Self-pay | Admitting: Nurse Practitioner

## 2022-05-12 MED ORDER — VALACYCLOVIR HCL 500 MG PO TABS: 500.0000 mg | ORAL_TABLET | Freq: Every day | ORAL | 1 refills | Status: DC

## 2022-05-24 ENCOUNTER — Inpatient Hospital Stay: Payer: 59 | Admitting: Oncology

## 2022-05-24 ENCOUNTER — Inpatient Hospital Stay: Payer: 59

## 2022-09-16 ENCOUNTER — Other Ambulatory Visit: Payer: Self-pay

## 2022-09-16 NOTE — Telephone Encounter (Signed)
Medication refill for Losartan last ov 03/01/22, upcoming ov no future appt . Please advise

## 2022-09-21 ENCOUNTER — Other Ambulatory Visit: Payer: Self-pay

## 2022-09-21 NOTE — Telephone Encounter (Signed)
Medication refill for Losartan 100 mg last ov 03/01/22, upcoming ov none . Please advise

## 2022-10-04 ENCOUNTER — Encounter: Payer: Self-pay | Admitting: Nurse Practitioner

## 2022-10-04 MED ORDER — LOSARTAN POTASSIUM 100 MG PO TABS: 100.0000 mg | ORAL_TABLET | Freq: Every day | ORAL | 0 refills | Status: DC

## 2022-10-11 ENCOUNTER — Ambulatory Visit: Payer: 59 | Admitting: Nurse Practitioner

## 2022-10-11 ENCOUNTER — Encounter: Payer: Self-pay | Admitting: Nurse Practitioner

## 2022-10-11 VITALS — BP 138/99 | HR 102 | Temp 98.8°F | Ht 63.09 in | Wt 161.5 lb

## 2022-10-11 DIAGNOSIS — Z0000 Encounter for general adult medical examination without abnormal findings: Secondary | ICD-10-CM | POA: Diagnosis not present

## 2022-10-11 DIAGNOSIS — I10 Essential (primary) hypertension: Secondary | ICD-10-CM | POA: Diagnosis not present

## 2022-10-11 DIAGNOSIS — Z23 Encounter for immunization: Secondary | ICD-10-CM

## 2022-10-11 DIAGNOSIS — E781 Pure hyperglyceridemia: Secondary | ICD-10-CM | POA: Insufficient documentation

## 2022-10-11 LAB — MICROSCOPIC EXAMINATION

## 2022-10-11 LAB — URINALYSIS, ROUTINE W REFLEX MICROSCOPIC
Bilirubin, UA: NEGATIVE
Glucose, UA: NEGATIVE
Nitrite, UA: NEGATIVE
RBC, UA: NEGATIVE
Specific Gravity, UA: 1.02 (ref 1.005–1.030)
Urobilinogen, Ur: 1 mg/dL (ref 0.2–1.0)
pH, UA: 7 (ref 5.0–7.5)

## 2022-10-11 MED ORDER — VALACYCLOVIR HCL 500 MG PO TABS: 500.0000 mg | ORAL_TABLET | Freq: Every day | ORAL | 1 refills | Status: DC

## 2022-10-11 MED ORDER — VALSARTAN 160 MG PO TABS: 160.0000 mg | ORAL_TABLET | Freq: Every day | ORAL | 0 refills | Status: DC

## 2022-10-11 NOTE — Progress Notes (Signed)
BP (!) 133/96   Pulse (!) 102   Temp 98.8 F (37.1 C) (Oral)   Ht 5' 3.09" (1.602 m)   Wt 161 lb 8 oz (73.3 kg)   LMP 09/20/2022 (Approximate)   SpO2 98%   Breastfeeding No   BMI 28.53 kg/m    Subjective:    Patient ID: Whitney Rodriguez, female    DOB: October 01, 1986, 36 y.o.   MRN: [id number]  HPI: Whitney Rodriguez is a 36 y.o. female presenting on 10/11/2022 for comprehensive medical examination. Current medical complaints include:none  She currently lives with: Menopausal Symptoms: no  HYPERTENSION with Chronic Kidney Disease Hypertension status: uncontrolled  Satisfied with current treatment? no Duration of hypertension: years BP monitoring frequency:  daily BP range: 140-90 BP medication side effects:  no Medication compliance: excellent compliance Previous BP meds:losartan (cozaar) Aspirin: no Recurrent headaches: no Visual changes: no Palpitations: no Dyspnea: no Chest pain: no Lower extremity edema: no Dizzy/lightheaded: no   Depression Screen done today and results listed below:     10/11/2022    1:26 PM 03/01/2022    8:35 AM 05/26/2021    1:23 PM 09/21/2019    4:19 PM 02/16/2018    9:49 AM  Depression screen PHQ 2/9  Decreased Interest 0 0 0 0 0  Down, Depressed, Hopeless 0 0 0 0 0  PHQ - 2 Score 0 0 0 0 0  Altered sleeping 0 0 0  0  Tired, decreased energy 0 0 0  0  Change in appetite 0 0 0  0  Feeling bad or failure about yourself  0 0 0  0  Trouble concentrating 0 0 0  0  Moving slowly or fidgety/restless 0 0 0  0  Suicidal thoughts 0 0 0  0  PHQ-9 Score 0 0 0  0  Difficult doing work/chores Not difficult at all Not difficult at all       The patient does not have a history of falls. I did complete a risk assessment for falls. A plan of care for falls was documented.   Past Medical History:  Past Medical History:  Diagnosis Date   Cystic fibrosis gene carrier    fob is negative for gene   Herpes    Hypertension    Medical history  non-contributory     Surgical History:  Past Surgical History:  Procedure Laterality Date   TUBAL LIGATION Bilateral 10/09/2013   Procedure: POST PARTUM TUBAL LIGATION;  Surgeon: Willodean Rosenthal, MD;  Location: [facility];  Service: Gynecology;  Laterality: Bilateral;    Medications:  No current outpatient medications on file prior to visit.   No current facility-administered medications on file prior to visit.    Allergies:  Allergies  Allergen Reactions   Lisinopril Cough    Social History:  Social History   Socioeconomic History   Marital status: Single    Spouse name: Not on file   Number of children: Not on file   Years of education: Not on file   Highest education level: Not on file  Occupational History   Not on file  Tobacco Use   Smoking status: Every Day    Packs/day: 0.25    Years: 7.00    Total pack years: 1.75    Types: Cigarettes   Smokeless tobacco: Never  Vaping Use   Vaping Use: Some days  Substance and Sexual Activity   Alcohol use: Yes    Alcohol/week: 2.0 standard drinks of alcohol  Types: 1 Glasses of wine, 1 Cans of beer per week    Comment: Using electronic cigarette rarely at this point/ pt is social drinker when not pregnant.   Drug use: No   Sexual activity: Yes    Partners: Male    Birth control/protection: Surgical  Other Topics Concern   Not on file  Social History Narrative   Not on file   Social Determinants of Health   Financial Resource Strain: Not on file  Food Insecurity: Not on file  Transportation Needs: Not on file  Physical Activity: Not on file  Stress: Not on file  Social Connections: Not on file  Intimate Partner Violence: Not on file   Social History   Tobacco Use  Smoking Status Every Day   Packs/day: 0.25   Years: 7.00   Total pack years: 1.75   Types: Cigarettes  Smokeless Tobacco Never   Social History   Substance and Sexual Activity  Alcohol Use Yes   Alcohol/week: 2.0 standard drinks  of alcohol   Types: 1 Glasses of wine, 1 Cans of beer per week   Comment: Using electronic cigarette rarely at this point/ pt is social drinker when not pregnant.    Family History:  Family History  Problem Relation Age of Onset   Hypertension Father     Past medical history, surgical history, medications, allergies, family history and social history reviewed with patient today and changes made to appropriate areas of the chart.   Review of Systems  Eyes:  Negative for blurred vision and double vision.  Respiratory:  Negative for shortness of breath.   Cardiovascular:  Negative for chest pain, palpitations and leg swelling.  Neurological:  Negative for dizziness and headaches.   All other ROS negative except what is listed above and in the HPI.      Objective:    BP (!) 133/96   Pulse (!) 102   Temp 98.8 F (37.1 C) (Oral)   Ht 5' 3.09" (1.602 m)   Wt 161 lb 8 oz (73.3 kg)   LMP 09/20/2022 (Approximate)   SpO2 98%   Breastfeeding No   BMI 28.53 kg/m   Wt Readings from Last 3 Encounters:  10/11/22 161 lb 8 oz (73.3 kg)  03/01/22 166 lb 3.2 oz (75.4 kg)  12/31/21 173 lb 4.8 oz (78.6 kg)    Physical Exam Vitals and nursing note reviewed.  Constitutional:      General: She is awake. She is not in acute distress.    Appearance: She is well-developed and normal weight. She is not ill-appearing.  HENT:     Head: Normocephalic and atraumatic.     Right Ear: Hearing, tympanic membrane, ear canal and external ear normal. No drainage.     Left Ear: Hearing, tympanic membrane, ear canal and external ear normal. No drainage.     Nose: Nose normal.     Right Sinus: No maxillary sinus tenderness or frontal sinus tenderness.     Left Sinus: No maxillary sinus tenderness or frontal sinus tenderness.     Mouth/Throat:     Mouth: Mucous membranes are moist.     Pharynx: Oropharynx is clear. Uvula midline. No pharyngeal swelling, oropharyngeal exudate or posterior oropharyngeal  erythema.  Eyes:     General: Lids are normal.        Right eye: No discharge.        Left eye: No discharge.     Extraocular Movements: Extraocular movements intact.  Conjunctiva/sclera: Conjunctivae normal.     Pupils: Pupils are equal, round, and reactive to light.     Visual Fields: Right eye visual fields normal and left eye visual fields normal.  Neck:     Thyroid: No thyromegaly.     Vascular: No carotid bruit.     Trachea: Trachea normal.  Cardiovascular:     Rate and Rhythm: Normal rate and regular rhythm.     Heart sounds: Normal heart sounds. No murmur heard.    No gallop.  Pulmonary:     Effort: Pulmonary effort is normal. No accessory muscle usage or respiratory distress.     Breath sounds: Normal breath sounds.  Chest:  Breasts:    Right: Normal.     Left: Normal.  Abdominal:     General: Bowel sounds are normal.     Palpations: Abdomen is soft. There is no hepatomegaly or splenomegaly.     Tenderness: There is no abdominal tenderness.  Musculoskeletal:        General: Normal range of motion.     Cervical back: Normal range of motion and neck supple.     Right lower leg: No edema.     Left lower leg: No edema.  Lymphadenopathy:     Head:     Right side of head: No submental, submandibular, tonsillar, preauricular or posterior auricular adenopathy.     Left side of head: No submental, submandibular, tonsillar, preauricular or posterior auricular adenopathy.     Cervical: No cervical adenopathy.     Upper Body:     Right upper body: No supraclavicular, axillary or pectoral adenopathy.     Left upper body: No supraclavicular, axillary or pectoral adenopathy.  Skin:    General: Skin is warm and dry.     Capillary Refill: Capillary refill takes less than 2 seconds.     Findings: No rash.  Neurological:     Mental Status: She is alert and oriented to person, place, and time.     Gait: Gait is intact.     Deep Tendon Reflexes: Reflexes are normal and  symmetric.     Reflex Scores:      Brachioradialis reflexes are 2+ on the right side and 2+ on the left side.      Patellar reflexes are 2+ on the right side and 2+ on the left side. Psychiatric:        Attention and Perception: Attention normal.        Mood and Affect: Mood normal.        Speech: Speech normal.        Behavior: Behavior normal. Behavior is cooperative.        Thought Content: Thought content normal.        Judgment: Judgment normal.     Results for orders placed or performed in visit on 12/31/21  CBC with Differential/Platelet  Result Value Ref Range   WBC 8.1 4.0 - 10.5 K/uL   RBC 4.70 3.87 - 5.11 MIL/uL   Hemoglobin 16.1 (H) 12.0 - 15.0 g/dL   HCT 25.4 (H) 27.0 - 62.3 %   MCV 100.4 (H) 80.0 - 100.0 fL   MCH 34.3 (H) 26.0 - 34.0 pg   MCHC 34.1 30.0 - 36.0 g/dL   RDW 76.2 83.1 - 51.7 %   Platelets 261 150 - 400 K/uL   nRBC 0.0 0.0 - 0.2 %   Neutrophils Relative % 65 %   Neutro Abs 5.4 1.7 - 7.7 K/uL  Lymphocytes Relative 25 %   Lymphs Abs 2.0 0.7 - 4.0 K/uL   Monocytes Relative 5 %   Monocytes Absolute 0.4 0.1 - 1.0 K/uL   Eosinophils Relative 3 %   Eosinophils Absolute 0.2 0.0 - 0.5 K/uL   Basophils Relative 1 %   Basophils Absolute 0.0 0.0 - 0.1 K/uL   Immature Granulocytes 1 %   Abs Immature Granulocytes 0.04 0.00 - 0.07 K/uL      Assessment & Plan:   Problem List Items Addressed This Visit       Cardiovascular and Mediastinum   Essential hypertension    Chronic. Not well controlled.  Runs 140/90s at home.  Will change from Losartan to Valsartan 160mg  daily.  Labs ordered today.  Follow up in 2 months.  Call sooner if concerns arise.       Relevant Medications   valsartan (DIOVAN) 160 MG tablet     Other   Hypertriglyceridemia    Labs ordered at visit today.  Will make recommendations based on lab results.        Relevant Medications   valsartan (DIOVAN) 160 MG tablet   Other Relevant Orders   Lipid panel   Other Visit Diagnoses      Annual physical exam    -  Primary   Health maintenance reviewed during visit today.  Labs ordered. Flu shot given.  Will get PAP at next visit.    Relevant Orders   CBC with Differential/Platelet   Comprehensive metabolic panel   Lipid panel   TSH   Urinalysis, Routine w reflex microscopic   Hepatitis C Antibody   Need for influenza vaccination       Relevant Orders   Flu Vaccine QUAD 6+ mos PF IM (Fluarix Quad PF)        Follow up plan: Return in about 1 month (around 11/11/2022) for BP Check and PAP.   LABORATORY TESTING:  - Pap smear:  Will do at next visit.   IMMUNIZATIONS:   - Tdap: Tetanus vaccination status reviewed: last tetanus booster within 10 years. - Influenza: Administered today - Pneumovax: Refused - Prevnar: Not applicable - COVID: Not applicable - HPV: Not applicable - Shingrix vaccine: Not applicable  SCREENING: -Mammogram: Not applicable  - Colonoscopy: Not applicable  - Bone Density: Not applicable  -Hearing Test: Not applicable  -Spirometry: Not applicable   PATIENT COUNSELING:   Advised to take 1 mg of folate supplement per day if capable of pregnancy.   Sexuality: Discussed sexually transmitted diseases, partner selection, use of condoms, avoidance of unintended pregnancy  and contraceptive alternatives.   Advised to avoid cigarette smoking.  I discussed with the patient that most people either abstain from alcohol or drink within safe limits (<=14/week and <=4 drinks/occasion for males, <=7/weeks and <= 3 drinks/occasion for females) and that the risk for alcohol disorders and other health effects rises proportionally with the number of drinks per week and how often a drinker exceeds daily limits.  Discussed cessation/primary prevention of drug use and availability of treatment for abuse.   Diet: Encouraged to adjust caloric intake to maintain  or achieve ideal body weight, to reduce intake of dietary saturated fat and total fat, to  limit sodium intake by avoiding high sodium foods and not adding table salt, and to maintain adequate dietary potassium and calcium preferably from fresh fruits, vegetables, and low-fat dairy products.    stressed the importance of regular exercise  Injury prevention: Discussed safety belts, safety helmets,  smoke detector, smoking near bedding or upholstery.   Dental health: Discussed importance of regular tooth brushing, flossing, and dental visits.    NEXT PREVENTATIVE PHYSICAL DUE IN 1 YEAR. Return in about 1 month (around 11/11/2022) for BP Check and PAP.

## 2022-10-11 NOTE — Assessment & Plan Note (Signed)
Labs ordered at visit today.  Will make recommendations based on lab results.   

## 2022-10-11 NOTE — Assessment & Plan Note (Signed)
Chronic. Not well controlled.  Runs 140/90s at home.  Will change from Losartan to Valsartan 160mg  daily.  Labs ordered today.  Follow up in 2 months.  Call sooner if concerns arise.

## 2022-10-12 LAB — COMPREHENSIVE METABOLIC PANEL
ALT: 11 IU/L (ref 0–32)
AST: 14 IU/L (ref 0–40)
Albumin/Globulin Ratio: 1.9 (ref 1.2–2.2)
Albumin: 4.5 g/dL (ref 3.9–4.9)
Alkaline Phosphatase: 77 IU/L (ref 44–121)
BUN/Creatinine Ratio: 8 — ABNORMAL LOW (ref 9–23)
BUN: 7 mg/dL (ref 6–20)
Bilirubin Total: 0.8 mg/dL (ref 0.0–1.2)
CO2: 22 mmol/L (ref 20–29)
Calcium: 9.5 mg/dL (ref 8.7–10.2)
Chloride: 101 mmol/L (ref 96–106)
Creatinine, Ser: 0.83 mg/dL (ref 0.57–1.00)
Globulin, Total: 2.4 g/dL (ref 1.5–4.5)
Glucose: 87 mg/dL (ref 70–99)
Potassium: 4.1 mmol/L (ref 3.5–5.2)
Sodium: 140 mmol/L (ref 134–144)
Total Protein: 6.9 g/dL (ref 6.0–8.5)
eGFR: 92 mL/min/{1.73_m2} (ref >59)

## 2022-10-12 LAB — CBC WITH DIFFERENTIAL/PLATELET
Basophils Absolute: 0.1 10*3/uL (ref 0.0–0.2)
Basos: 1 %
EOS (ABSOLUTE): 0.2 10*3/uL (ref 0.0–0.4)
Eos: 2 %
Hematocrit: 48.3 % — ABNORMAL HIGH (ref 34.0–46.6)
Hemoglobin: 16.5 g/dL — ABNORMAL HIGH (ref 11.1–15.9)
Immature Grans (Abs): 0 10*3/uL (ref 0.0–0.1)
Immature Granulocytes: 0 %
Lymphocytes Absolute: 1.9 10*3/uL (ref 0.7–3.1)
Lymphs: 21 %
MCH: 34 pg — ABNORMAL HIGH (ref 26.6–33.0)
MCHC: 34.2 g/dL (ref 31.5–35.7)
MCV: 99 fL — ABNORMAL HIGH (ref 79–97)
Monocytes Absolute: 0.5 10*3/uL (ref 0.1–0.9)
Monocytes: 6 %
Neutrophils Absolute: 6 10*3/uL (ref 1.4–7.0)
Neutrophils: 70 %
Platelets: 293 10*3/uL (ref 150–450)
RBC: 4.86 x10E6/uL (ref 3.77–5.28)
RDW: 11.8 % (ref 11.7–15.4)
WBC: 8.6 10*3/uL (ref 3.4–10.8)

## 2022-10-12 LAB — LIPID PANEL
Chol/HDL Ratio: 2.4 ratio (ref 0.0–4.4)
Cholesterol, Total: 132 mg/dL (ref 100–199)
HDL: 54 mg/dL (ref >39)
LDL Chol Calc (NIH): 58 mg/dL (ref 0–99)
Triglycerides: 108 mg/dL (ref 0–149)
VLDL Cholesterol Cal: 20 mg/dL (ref 5–40)

## 2022-10-12 LAB — HEPATITIS C ANTIBODY: Hep C Virus Ab: NONREACTIVE

## 2022-10-12 LAB — TSH: TSH: 0.926 u[IU]/mL (ref 0.450–4.500)

## 2022-10-12 NOTE — Progress Notes (Signed)
Hi Whitney Rodriguez. It was nice to see you yesterday.  Your lab work looks good.  Your blood is a little thicker than normal.  We will keep an eye on this morning forward.  It is consistent with prior lab draws.  No concerns at this time. Continue with your current medication regimen.  Follow up as discussed.  Please let me know if you have any questions.

## 2022-10-14 ENCOUNTER — Other Ambulatory Visit: Payer: Self-pay | Admitting: Nurse Practitioner

## 2022-10-14 NOTE — Telephone Encounter (Signed)
Discontinued by provider at [place] on 10/11/22, will refuse this request.   Requested Prescriptions  Pending Prescriptions Disp Refills  . losartan (COZAAR) 100 MG tablet [Pharmacy Med Name: LOSARTAN POTASSIUM 100 MG TAB] 14 tablet 0    Sig: TAKE 1 TABLET (100 MG TOTAL) BY MOUTH DAILY. NEED APPOINTMENT FOR FURTHER REFILLS.     Cardiovascular:  Angiotensin Receptor Blockers Failed - 10/14/2022  1:38 AM      Failed - Last BP in normal range    BP Readings from Last 1 Encounters:  10/11/22 (!) 138/99         Passed - Cr in normal range and within 180 days    Creatinine, Ser  Date Value Ref Range Status  10/11/2022 0.83 0.57 - 1.00 mg/dL Final   Creatinine, Urine  Date Value Ref Range Status  10/08/2013 77.47 mg/dL Final         Passed - K in normal range and within 180 days    Potassium  Date Value Ref Range Status  10/11/2022 4.1 3.5 - 5.2 mmol/L Final         Passed - Patient is not pregnant      Passed - Valid encounter within last 6 months    Recent Outpatient Visits          3 days ago Annual physical exam   [place], NP   7 months ago Herpes simplex infection of genitourinary system   [facility] Vigg, Avanti, MD   10 months ago HSV-1 infection   [facility] McElwee, Lauren A, NP   1 year ago Hypertension, unspecified type   [facility] Vigg, Avanti, MD   2 years ago Vaginal lesion   [place], Lilia Argue, [place]      Future Appointments            In 1 month Jon Billings, NP [employer], [place]

## 2022-11-03 ENCOUNTER — Other Ambulatory Visit: Payer: Self-pay | Admitting: Nurse Practitioner

## 2022-11-03 NOTE — Telephone Encounter (Signed)
Unable to refill per protocol, Rx request is too soon, last RF 10/11/22 for 60. Will refuse.  Requested Prescriptions  Pending Prescriptions Disp Refills   valsartan (DIOVAN) 160 MG tablet [Pharmacy Med Name: VALSARTAN 160 MG TABLET] 90 tablet 1    Sig: TAKE 1 TABLET BY MOUTH EVERY DAY     Cardiovascular:  Angiotensin Receptor Blockers Failed - 11/03/2022  8:32 AM      Failed - Last BP in normal range    BP Readings from Last 1 Encounters:  10/11/22 (!) 138/99         Passed - Cr in normal range and within 180 days    Creatinine, Ser  Date Value Ref Range Status  10/11/2022 0.83 0.57 - 1.00 mg/dL Final   Creatinine, Urine  Date Value Ref Range Status  10/08/2013 77.47 mg/dL Final         Passed - K in normal range and within 180 days    Potassium  Date Value Ref Range Status  10/11/2022 4.1 3.5 - 5.2 mmol/L Final         Passed - Patient is not pregnant      Passed - Valid encounter within last 6 months    Recent Outpatient Visits           3 weeks ago Annual physical exam   [facility] Larae Grooms, NP   8 months ago Herpes simplex infection of genitourinary system   [facility] Vigg, Avanti, MD   10 months ago HSV-1 infection   [facility] McElwee, Lauren A, NP   1 year ago Hypertension, unspecified type   [facility] Vigg, Avanti, MD   2 years ago Vaginal lesion   [facility] [place], Salley Hews, [place]       Future Appointments             In 1 week Larae Grooms, NP [facility] [facility], [facility]

## 2022-11-15 ENCOUNTER — Ambulatory Visit: Payer: 59 | Admitting: Nurse Practitioner

## 2022-11-15 NOTE — Progress Notes (Deleted)
There were no vitals taken for this visit.   Subjective:    Patient ID: Estha Few, female    DOB: 10-20-1986, 36 y.o.   MRN: [id number]  HPI: Rupa Lagan is a 36 y.o. female  No chief complaint on file.  HYPERTENSION {Blank single:19197::"without","with"} Chronic Kidney Disease Hypertension status: {Blank single:19197::"controlled","uncontrolled","better","worse","exacerbated","stable"}  Satisfied with current treatment? {Blank single:19197::"yes","no"} Duration of hypertension: {Blank single:19197::"chronic","months","years"} BP monitoring frequency:  {Blank single:19197::"not checking","rarely","daily","weekly","monthly","a few times a day","a few times a week","a few times a month"} BP range:  BP medication side effects:  {Blank single:19197::"yes","no"} Medication compliance: {Blank single:19197::"excellent compliance","good compliance","fair compliance","poor compliance"} Previous BP meds:{Blank OFBPZWCH:85277::"OEUM","PNTIRWERXV","QMGQQPYPPJ/KDTOIZTIWP","YKDXIPJA","SNKNLZJQBH","ALPFXTKWIO/XBDZ","HGDJMEQAST (bystolic)","carvedilol","chlorthalidone","clonidine","diltiazem","exforge HCT","HCTZ","irbesartan (avapro)","labetalol","lisinopril","lisinopril-HCTZ","losartan (cozaar)","methyldopa","nifedipine","olmesartan (benicar)","olmesartan-HCTZ","quinapril","ramipril","spironalactone","tekturna","valsartan","valsartan-HCTZ","verapamil"} Aspirin: {Blank single:19197::"yes","no"} Recurrent headaches: {Blank single:19197::"yes","no"} Visual changes: {Blank single:19197::"yes","no"} Palpitations: {Blank single:19197::"yes","no"} Dyspnea: {Blank single:19197::"yes","no"} Chest pain: {Blank single:19197::"yes","no"} Lower extremity edema: {Blank single:19197::"yes","no"} Dizzy/lightheaded: {Blank single:19197::"yes","no"}  Relevant past medical, surgical, family and social history reviewed and updated as indicated. Interim medical history since our last visit reviewed. Allergies  and medications reviewed and updated.  Review of Systems  Per HPI unless specifically indicated above     Objective:    There were no vitals taken for this visit.  Wt Readings from Last 3 Encounters:  10/11/22 161 lb 8 oz (73.3 kg)  03/01/22 166 lb 3.2 oz (75.4 kg)  12/31/21 173 lb 4.8 oz (78.6 kg)    Physical Exam  Results for orders placed or performed in visit on 10/11/22  Microscopic Examination   Urine  Result Value Ref Range   WBC, UA 6-10 (A) 0 - 5 /hpf   RBC, Urine 0-2 0 - 2 /hpf   Epithelial Cells (non renal) 0-10 0 - 10 /hpf   Bacteria, UA Few None seen/Few  CBC with Differential/Platelet  Result Value Ref Range   WBC 8.6 3.4 - 10.8 x10E3/uL   RBC 4.86 3.77 - 5.28 x10E6/uL   Hemoglobin 16.5 (H) 11.1 - 15.9 g/dL   Hematocrit 48.3 (H) 34.0 - 46.6 %   MCV 99 (H) 79 - 97 fL   MCH 34.0 (H) 26.6 - 33.0 pg   MCHC 34.2 31.5 - 35.7 g/dL   RDW 11.8 11.7 - 15.4 %   Platelets 293 150 - 450 x10E3/uL   Neutrophils 70 Not Estab. %   Lymphs 21 Not Estab. %   Monocytes 6 Not Estab. %   Eos 2 Not Estab. %   Basos 1 Not Estab. %   Neutrophils Absolute 6.0 1.4 - 7.0 x10E3/uL   Lymphocytes Absolute 1.9 0.7 - 3.1 x10E3/uL   Monocytes Absolute 0.5 0.1 - 0.9 x10E3/uL   EOS (ABSOLUTE) 0.2 0.0 - 0.4 x10E3/uL   Basophils Absolute 0.1 0.0 - 0.2 x10E3/uL   Immature Granulocytes 0 Not Estab. %   Immature Grans (Abs) 0.0 0.0 - 0.1 x10E3/uL  Comprehensive metabolic panel  Result Value Ref Range   Glucose 87 70 - 99 mg/dL   BUN 7 6 - 20 mg/dL   Creatinine, Ser 0.83 0.57 - 1.00 mg/dL   eGFR 92 >59 mL/min/1.73   BUN/Creatinine Ratio 8 (L) 9 - 23   Sodium 140 134 - 144 mmol/L   Potassium 4.1 3.5 - 5.2 mmol/L   Chloride 101 96 - 106 mmol/L   CO2 22 20 - 29 mmol/L   Calcium 9.5 8.7 - 10.2 mg/dL   Total Protein 6.9 6.0 - 8.5 g/dL   Albumin 4.5 3.9 - 4.9 g/dL   Globulin, Total 2.4 1.5 - 4.5 g/dL   Albumin/Globulin Ratio 1.9 1.2 -  2.2   Bilirubin Total 0.8 0.0 - 1.2 mg/dL   Alkaline  Phosphatase 77 44 - 121 IU/L   AST 14 0 - 40 IU/L   ALT 11 0 - 32 IU/L  Lipid panel  Result Value Ref Range   Cholesterol, Total 132 100 - 199 mg/dL   Triglycerides 108 0 - 149 mg/dL   HDL 54 >39 mg/dL   VLDL Cholesterol Cal 20 5 - 40 mg/dL   LDL Chol Calc (NIH) 58 0 - 99 mg/dL   Chol/HDL Ratio 2.4 0.0 - 4.4 ratio  TSH  Result Value Ref Range   TSH 0.926 0.450 - 4.500 uIU/mL  Urinalysis, Routine w reflex microscopic  Result Value Ref Range   Specific Gravity, UA 1.020 1.005 - 1.030   pH, UA 7.0 5.0 - 7.5   Color, UA Yellow Yellow   Appearance Ur Clear Clear   Leukocytes,UA 1+ (A) Negative   Protein,UA Trace (A) Negative/Trace   Glucose, UA Negative Negative   Ketones, UA Trace (A) Negative   RBC, UA Negative Negative   Bilirubin, UA Negative Negative   Urobilinogen, Ur 1.0 0.2 - 1.0 mg/dL   Nitrite, UA Negative Negative   Microscopic Examination See below:   Hepatitis C Antibody  Result Value Ref Range   Hep C Virus Ab Non Reactive Non Reactive      Assessment & Plan:   Problem List Items Addressed This Visit       Cardiovascular and Mediastinum   Essential hypertension - Primary     Follow up plan: No follow-ups on file.

## 2022-12-10 ENCOUNTER — Other Ambulatory Visit: Payer: Self-pay | Admitting: Nurse Practitioner

## 2022-12-10 NOTE — Telephone Encounter (Signed)
Requested Prescriptions  Pending Prescriptions Disp Refills   valsartan (DIOVAN) 160 MG tablet [Pharmacy Med Name: VALSARTAN 160 MG TABLET] 90 tablet 1    Sig: TAKE 1 TABLET BY MOUTH EVERY DAY     Cardiovascular:  Angiotensin Receptor Blockers Failed - 12/10/2022  2:28 AM      Failed - Last BP in normal range    BP Readings from Last 1 Encounters:  10/11/22 (!) 138/99         Passed - Cr in normal range and within 180 days    Creatinine, Ser  Date Value Ref Range Status  10/11/2022 0.83 0.57 - 1.00 mg/dL Final   Creatinine, Urine  Date Value Ref Range Status  10/08/2013 77.47 mg/dL Final         Passed - K in normal range and within 180 days    Potassium  Date Value Ref Range Status  10/11/2022 4.1 3.5 - 5.2 mmol/L Final         Passed - Patient is not pregnant      Passed - Valid encounter within last 6 months    Recent Outpatient Visits           2 months ago Annual physical exam   [facility] Larae Grooms, NP   9 months ago Herpes simplex infection of genitourinary system   [facility] Vigg, Avanti, MD   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

## 2023-04-12 ENCOUNTER — Other Ambulatory Visit: Payer: Self-pay | Admitting: Nurse Practitioner

## 2023-04-12 NOTE — Telephone Encounter (Signed)
Requested Prescriptions  Pending Prescriptions Disp Refills   valACYclovir (VALTREX) 500 MG tablet [Pharmacy Med Name: VALACYCLOVIR HCL 500 MG TABLET] 90 tablet 1    Sig: TAKE 1 TABLET (500 MG TOTAL) BY MOUTH DAILY.     Antimicrobials:  Antiviral Agents - Anti-Herpetic Passed - 04/12/2023  1:19 AM      Passed - Valid encounter within last 12 months    Recent Outpatient Visits           6 months ago Annual physical exam   [place] [facility] Larae Grooms, NP   1 year ago Herpes simplex infection of genitourinary system   [place] [facility] Vigg, Avanti, MD   1 year ago HSV-1 infection   [place] [facility] McElwee, Lauren A, NP   1 year ago Hypertension, unspecified type   [place] [facility] Loura Pardon, MD   3 years ago Vaginal lesion   [facility] Roosvelt Maser [place], [place]

## 2023-06-22 ENCOUNTER — Other Ambulatory Visit: Payer: Self-pay | Admitting: Nurse Practitioner

## 2023-06-22 NOTE — Telephone Encounter (Signed)
Courtesy refill. Requested Prescriptions  Pending Prescriptions Disp Refills   valsartan (DIOVAN) 160 MG tablet [Pharmacy Med Name: VALSARTAN 160 MG TABLET] 30 tablet 0    Sig: TAKE 1 TABLET BY MOUTH EVERY DAY     Cardiovascular:  Angiotensin Receptor Blockers Failed - 06/22/2023  2:22 AM      Failed - Cr in normal range and within 180 days    Creatinine, Ser  Date Value Ref Range Status  10/11/2022 0.83 0.57 - 1.00 mg/dL Final   Creatinine, Urine  Date Value Ref Range Status  10/08/2013 77.47 mg/dL Final         Failed - K in normal range and within 180 days    Potassium  Date Value Ref Range Status  10/11/2022 4.1 3.5 - 5.2 mmol/L Final         Failed - Last BP in normal range    BP Readings from Last 1 Encounters:  10/11/22 (!) 138/99         Failed - Valid encounter within last 6 months    Recent Outpatient Visits           8 months ago Annual physical exam   [facility] Larae Grooms, NP   1 year ago Herpes simplex infection of genitourinary system   [place] [facility] Vigg, Avanti, MD   1 year ago HSV-1 infection   [place] [facility] [place], Lauren A, NP   2 years ago Hypertension, unspecified type   [place] [facility] Vigg, Avanti, MD   3 years ago Vaginal lesion   [place] [facility] Particia Nearing, [place]              Passed - Patient is not pregnant

## 2023-07-23 ENCOUNTER — Other Ambulatory Visit: Payer: Self-pay | Admitting: Nurse Practitioner

## 2023-07-25 NOTE — Telephone Encounter (Signed)
Requested medication (s) are due for refill today: yes  Requested medication (s) are on the active medication list: yes  Last refill:  06/22/23  Future visit scheduled: no  Notes to clinic:  Unable to refill per protocol, courtesy refill already given, routing for provider approval.      Requested Prescriptions  Pending Prescriptions Disp Refills   valsartan (DIOVAN) 160 MG tablet [Pharmacy Med Name: VALSARTAN 160 MG TABLET] 90 tablet 1    Sig: TAKE 1 TABLET BY MOUTH EVERY DAY     Cardiovascular:  Angiotensin Receptor Blockers Failed - 07/23/2023  9:31 AM      Failed - Cr in normal range and within 180 days    Creatinine, Ser  Date Value Ref Range Status  10/11/2022 0.83 0.57 - 1.00 mg/dL Final   Creatinine, Urine  Date Value Ref Range Status  10/08/2013 77.47 mg/dL Final         Failed - K in normal range and within 180 days    Potassium  Date Value Ref Range Status  10/11/2022 4.1 3.5 - 5.2 mmol/L Final         Failed - Last BP in normal range    BP Readings from Last 1 Encounters:  10/11/22 (!) 138/99         Failed - Valid encounter within last 6 months    Recent Outpatient Visits           9 months ago Annual physical exam   [place] [facility] Larae Grooms, NP   1 year ago Herpes simplex infection of genitourinary system   [place] [facility] Vigg, Avanti, MD   1 year ago HSV-1 infection   [place] [facility] [place], Lauren A, NP   2 years ago Hypertension, unspecified type   [place] [facility] Vigg, Avanti, MD   3 years ago Vaginal lesion   [facility] Particia Nearing, [place]              Passed - Patient is not pregnant

## 2023-07-27 ENCOUNTER — Other Ambulatory Visit: Payer: Self-pay | Admitting: Nurse Practitioner

## 2023-07-28 NOTE — Telephone Encounter (Signed)
Requested medication (s) are due for refill today: yes  Requested medication (s) are on the active medication list: yes  Last refill:  06/22/23  Future visit scheduled: no  Notes to clinic:  Unable to refill per protocol, courtesy refill already given, routing for provider approval.      Requested Prescriptions  Pending Prescriptions Disp Refills   valsartan (DIOVAN) 160 MG tablet [Pharmacy Med Name: VALSARTAN 160 MG TABLET] 30 tablet 0    Sig: TAKE 1 TABLET BY MOUTH EVERY DAY     Cardiovascular:  Angiotensin Receptor Blockers Failed - 07/27/2023  2:19 AM      Failed - Cr in normal range and within 180 days    Creatinine, Ser  Date Value Ref Range Status  10/11/2022 0.83 0.57 - 1.00 mg/dL Final   Creatinine, Urine  Date Value Ref Range Status  10/08/2013 77.47 mg/dL Final         Failed - K in normal range and within 180 days    Potassium  Date Value Ref Range Status  10/11/2022 4.1 3.5 - 5.2 mmol/L Final         Failed - Last BP in normal range    BP Readings from Last 1 Encounters:  10/11/22 (!) 138/99         Failed - Valid encounter within last 6 months    Recent Outpatient Visits           9 months ago Annual physical exam   [place] [facility] Larae Grooms, NP   1 year ago Herpes simplex infection of genitourinary system   [place] [facility] Vigg, Avanti, MD   1 year ago HSV-1 infection   [place] [facility] [place], Lauren A, NP   2 years ago Hypertension, unspecified type    [place] [facility] Vigg, Avanti, MD   3 years ago Vaginal lesion   [facility] Particia [place], [place]              Passed - Patient is not pregnant

## 2023-07-30 ENCOUNTER — Other Ambulatory Visit: Payer: Self-pay | Admitting: Nurse Practitioner

## 2023-08-01 ENCOUNTER — Other Ambulatory Visit: Payer: Self-pay | Admitting: Nurse Practitioner

## 2023-08-01 ENCOUNTER — Encounter: Payer: Self-pay | Admitting: Oncology

## 2023-08-01 NOTE — Telephone Encounter (Signed)
Medication Refill - Medication: valsartan (DIOVAN) 160 MG tablet [[id number]]   Has the patient contacted their pharmacy? Yes.    (Agent: If yes, when and what did the pharmacy advise?) Contact PCP   Preferred Pharmacy (with phone number or street name): [id number] - [place], [place] - [address]   Has the patient been seen for an appointment in the last year OR does the patient have an upcoming appointment? Yes.    Agent: Please be advised that RX refills may take up to 3 business days. We ask that you follow-up with your pharmacy.   Pt is requesting enough to last her until her appointment on 08/05/23. Pt is completely out of medication.

## 2023-08-02 ENCOUNTER — Other Ambulatory Visit: Payer: Self-pay | Admitting: Nurse Practitioner

## 2023-08-02 MED ORDER — VALSARTAN 160 MG PO TABS: 160.0000 mg | ORAL_TABLET | Freq: Every day | ORAL | 0 refills | Status: DC

## 2023-08-02 NOTE — Telephone Encounter (Signed)
Patient requesting medication to get to appointment on 08/05/23

## 2023-08-02 NOTE — Telephone Encounter (Signed)
Requested Prescriptions  Pending Prescriptions Disp Refills   valsartan (DIOVAN) 160 MG tablet 30 tablet 0    Sig: Take 1 tablet (160 mg total) by mouth daily.     Cardiovascular:  Angiotensin Receptor Blockers Failed - 08/01/2023 11:22 AM      Failed - Cr in normal range and within 180 days    Creatinine, Ser  Date Value Ref Range Status  10/11/2022 0.83 0.57 - 1.00 mg/dL Final   Creatinine, Urine  Date Value Ref Range Status  10/08/2013 77.47 mg/dL Final         Failed - K in normal range and within 180 days    Potassium  Date Value Ref Range Status  10/11/2022 4.1 3.5 - 5.2 mmol/L Final         Failed - Last BP in normal range    BP Readings from Last 1 Encounters:  10/11/22 (!) 138/99         Failed - Valid encounter within last 6 months    Recent Outpatient Visits           9 months ago Annual physical exam   [place] [facility] Larae Grooms, NP   1 year ago Herpes simplex infection of genitourinary system   [place] [facility] Vigg, Avanti, MD   1 year ago HSV-1 infection   [place] [facility] McElwee, Lauren A, NP   2 years ago Hypertension, unspecified type   [place] [facility] Vigg, Avanti, MD   3 years ago Vaginal lesion   [place] [facility] Particia Nearing, [place]       Future Appointments             In 3 days Mecum, Oswaldo Conroy, PA-C [place] [facility]            Passed - Patient is not pregnant

## 2023-08-05 ENCOUNTER — Ambulatory Visit: Payer: 59 | Admitting: Physician Assistant

## 2023-08-05 ENCOUNTER — Encounter: Payer: Self-pay | Admitting: Physician Assistant

## 2023-08-05 VITALS — BP 138/98 | HR 102 | Ht 63.0 in | Wt 166.0 lb

## 2023-08-05 DIAGNOSIS — I10 Essential (primary) hypertension: Secondary | ICD-10-CM | POA: Diagnosis not present

## 2023-08-05 MED ORDER — LOSARTAN POTASSIUM 100 MG PO TABS: 100.0000 mg | ORAL_TABLET | Freq: Every day | ORAL | 0 refills | Status: DC

## 2023-08-05 NOTE — Assessment & Plan Note (Signed)
Chronic, historic condition Does not appear consistently controlled and she has been out of medications for several days She would like to try going back on Losartan instead of Valsartan as she thinks she has had better control and less side effects the past few days while she has been using this to bridge to her apt today Will send in script for Losartan 100 mg PO every day per request She is due for follow up in about 3 months so will assess response at that time. Contact office sooner if concerns arise

## 2023-08-05 NOTE — Progress Notes (Signed)
Acute Office Visit   Patient: Whitney Rodriguez   DOB: 1986/11/21   37 y.o. Female  MRN: [id number] Visit Date: 08/05/2023  Today's healthcare provider: Oswaldo Conroy Deaundra Dupriest, PA-C  Introduced myself to the patient as a Secondary school teacher and provided education on APPs in clinical practice.    Chief Complaint  Patient presents with   Hypertension    Patient says she has been out of her Hypertension medication since Sunday. Patient says she had of Losartan vs Valsartan (current prescription) and she was taking the old medication. Patient says she noticed some elevated readings and says she was doing better at taking her blood pressure medication. Patient is requesting a refill on her BP medication at today's visit. Patient says she noticed when her BP is elevated, cause she will have feeling of "feeling hot and flushed."   Subjective    HPI HPI     Hypertension    Additional comments: Patient says she has been out of her Hypertension medication since Sunday. Patient says she had of Losartan vs Valsartan (current prescription) and she was taking the old medication. Patient says she noticed some elevated readings and says she was doing better at taking her blood pressure medication. Patient is requesting a refill on her BP medication at today's visit. Patient says she noticed when her BP is elevated, cause she will have feeling of "feeling hot and flushed."      Last edited by Whitney Rodriguez, CMA on 08/05/2023  3:35 PM.      Hypertension: - Medications: Valsartan- she has been out of medication since Sat  She had some Losartan left over and was taking them to bridge until apt  She is thinks the losartan was working better than the Valsartan and wants to take this again instead of valsartan  When she was taking the Valsartan she would get flushed and hot when it was time for her next dose but she did not have this with the Losartan the past few days  She reports today's readings are the best her BP has  been for some time  - Compliance: fair  - Checking BP at home: not checking at home     Medications: Outpatient Medications Prior to Visit  Medication Sig   valACYclovir (VALTREX) 500 MG tablet TAKE 1 TABLET (500 MG TOTAL) BY MOUTH DAILY.   [DISCONTINUED] valsartan (DIOVAN) 160 MG tablet Take 1 tablet (160 mg total) by mouth daily.   No facility-administered medications prior to visit.    Review of Systems  Eyes:  Negative for visual disturbance.  Respiratory:  Negative for shortness of breath and wheezing.   Cardiovascular:  Negative for chest pain, palpitations and leg swelling.  Neurological:  Negative for dizziness and headaches.        Objective    BP (!) 138/98   Pulse (!) 102   Ht 5\' 3"  (1.6 m)   Wt 166 lb (75.3 kg)   SpO2 99%   BMI 29.41 kg/m     Physical Exam Vitals reviewed.  Constitutional:      General: She is awake.     Appearance: Normal appearance. She is well-developed and well-groomed.  HENT:     Head: Normocephalic and atraumatic.  Cardiovascular:     Rate and Rhythm: Normal rate and regular rhythm.     Pulses:          Radial pulses are 2+ on the right side and 2+ on the  left side.     Heart sounds: Normal heart sounds. No murmur heard.    No friction rub. No gallop.  Pulmonary:     Effort: Pulmonary effort is normal.     Breath sounds: Normal breath sounds. No decreased air movement. No decreased breath sounds, wheezing, rhonchi or rales.  Musculoskeletal:     Cervical back: Normal range of motion.  Neurological:     Mental Status: She is alert.  Psychiatric:        Behavior: Behavior is cooperative.       No results found for any visits on 08/05/23.  Assessment & Plan      Return in about 3 months (around 11/05/2023) for Annual physical and fasting labs .      Problem List Items Addressed This Visit       Cardiovascular and Mediastinum   Essential hypertension - Primary    Chronic, historic condition Does not appear  consistently controlled and she has been out of medications for several days She would like to try going back on Losartan instead of Valsartan as she thinks she has had better control and less side effects the past few days while she has been using this to bridge to her apt today Will send in script for Losartan 100 mg PO every day per request She is due for follow up in about 3 months so will assess response at that time. Contact office sooner if concerns arise       Relevant Medications   losartan (COZAAR) 100 MG tablet     Return in about 3 months (around 11/05/2023) for Annual physical and fasting labs .   I, Whitney Cutshaw E Fedra Lanter, PA-C, have reviewed all documentation for this visit. The documentation on 08/05/23 for the exam, diagnosis, procedures, and orders are all accurate and complete.   Whitney Rodriguez, MHS, PA-C [facility]

## 2023-08-25 ENCOUNTER — Ambulatory Visit: Payer: 59 | Admitting: Physician Assistant

## 2023-08-25 ENCOUNTER — Encounter: Payer: Self-pay | Admitting: Physician Assistant

## 2023-08-25 VITALS — BP 132/90 | HR 106 | Ht 63.0 in | Wt 165.2 lb

## 2023-08-25 DIAGNOSIS — A6000 Herpesviral infection of urogenital system, unspecified: Secondary | ICD-10-CM | POA: Diagnosis not present

## 2023-08-25 NOTE — Progress Notes (Signed)
Acute Office Visit   Patient: Whitney Rodriguez   DOB: 08/13/1986   37 y.o. Female  MRN: [id number] Visit Date: 08/25/2023  Today's healthcare provider: Oswaldo Conroy Kayle Passarelli, PA-C  Introduced myself to the patient as a Secondary school teacher and provided education on APPs in clinical practice.    Chief Complaint  Patient presents with   Herpes Zoster    Patient says Friday she noticed a little spot in the outer R side of her vaginal area. Patient says she has increase her Valtrex of 500 mg daily and increased it to 4 tablets. Patient says Saturday it did not get any better and says it was more pain. Patient says she recently shaved and says she is not sure if her underwear irritated the area. Patient says it has not gotten better and she is not sure why the area is not gotten better.    Subjective    HPI HPI     Herpes Zoster    Additional comments: Patient says Friday she noticed a little spot in the outer R side of her vaginal area. Patient says she has increase her Valtrex of 500 mg daily and increased it to 4 tablets. Patient says Saturday it did not get any better and says it was more pain. Patient says she recently shaved and says she is not sure if her underwear irritated the area. Patient says it has not gotten better and she is not sure why the area is not gotten better.       Last edited by [name], CMA on 08/25/2023 11:06 AM.       She reports concerns for lesion in her vulvovaginal area She has been taking Valtrex and has increased the dose since the lesion appeared  She reports shaving the area recently and notes some irritation in the same area on the right external aspect of the genital region She reports increasing her Valtrex to 4 tablets since Sat  She states the area has not improved despite these measures - it usually improves in about 2 days  She denies open wound, drainage, swelling or redness,  She reports some mild discomfort on Sat - sharp pains and irritation from  underwear      Medications: Outpatient Medications Prior to Visit  Medication Sig   losartan (COZAAR) 100 MG tablet Take 1 tablet (100 mg total) by mouth daily.   valACYclovir (VALTREX) 500 MG tablet TAKE 1 TABLET (500 MG TOTAL) BY MOUTH DAILY.   No facility-administered medications prior to visit.    Review of Systems  Genitourinary:  Positive for genital sores.        Objective    BP (!) 132/90   Pulse (!) 106   Ht 5\' 3"  (1.6 m)   Wt 165 lb 3.2 oz (74.9 kg)   SpO2 98%   BMI 29.26 kg/m     Physical Exam Vitals reviewed. Exam conducted with a chaperone present.  Constitutional:      General: She is awake.     Appearance: Normal appearance. She is well-developed and well-groomed.  HENT:     Head: Normocephalic and atraumatic.  Pulmonary:     Effort: Pulmonary effort is normal.  Genitourinary:    General: Normal vulva.     Pubic Area: No rash or pubic lice.      Tanner stage (genital): 5.     Labia:        Right: No rash, tenderness, lesion or  injury.        Left: No rash, tenderness, lesion or injury.      Comments: Chaperoned by [name], CMA  Neurological:     Mental Status: She is alert.  Psychiatric:        Attention and Perception: Attention normal.        Mood and Affect: Mood is anxious.        Speech: Speech normal.        Behavior: Behavior normal. Behavior is cooperative.       No results found for any visits on 08/25/23.  Assessment & Plan      No follow-ups on file.      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 She reports that the area did not seem to be improving and he  usually does in about 2 days when she increases her Valtrex for flares Physical exam revealed overall normal vulvovaginal area without signs of lesions, swelling, redness, drainage, underlying cysts Reassured patient that did not appear that she is having an HSV flare at time of appointment or if she was it is likely resolving/resolved at this time Recommend she return to her Valtrex 500 mg p.o. daily dosing for now Reviewed return precautions Follow-up as needed         No follow-ups on file.   I, Dajaun Goldring E Ercole Georg, PA-C, have reviewed all documentation for this visit. The documentation on 08/29/23 for the exam, diagnosis, procedures, and orders are all accurate and complete.   Jacquelin Hawking, MHS, PA-C [facility]

## 2023-08-29 NOTE — Assessment & Plan Note (Signed)
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 She reports that the area did not seem to be improving and he usually does in about 2 days when she increases her Valtrex for flares Physical exam revealed overall normal vulvovaginal area without signs of lesions, swelling, redness, drainage, underlying cysts Reassured patient that did not appear that she is having an HSV flare at time of appointment or if she was it is likely resolving/resolved at this time Recommend she return to her Valtrex 500 mg p.o. daily dosing for now Reviewed return precautions Follow-up as needed

## 2023-08-31 ENCOUNTER — Other Ambulatory Visit: Payer: Self-pay | Admitting: Nurse Practitioner

## 2023-09-01 NOTE — Telephone Encounter (Signed)
Medication discontinued 08/05/23 by provider  Requested Prescriptions  Pending Prescriptions Disp Refills   valsartan (DIOVAN) 160 MG tablet [Pharmacy Med Name: VALSARTAN 160 MG TABLET] 30 tablet 0    Sig: TAKE 1 TABLET BY MOUTH EVERY DAY     Cardiovascular:  Angiotensin Receptor Blockers Failed - 08/31/2023  1:30 AM      Failed - Cr in normal range and within 180 days    Creatinine, Ser  Date Value Ref Range Status  10/11/2022 0.83 0.57 - 1.00 mg/dL Final   Creatinine, Urine  Date Value Ref Range Status  10/08/2013 77.47 mg/dL Final         Failed - K in normal range and within 180 days    Potassium  Date Value Ref Range Status  10/11/2022 4.1 3.5 - 5.2 mmol/L Final         Failed - Last BP in normal range    BP Readings from Last 1 Encounters:  08/25/23 (!) 132/90         Passed - Patient is not pregnant      Passed - Valid encounter within last 6 months    Recent Outpatient Visits           1 week ago Herpes simplex infection of genitourinary system   [place] [facility] Mecum, Oswaldo Conroy, PA-C   3 weeks ago Essential hypertension   [place] [facility] Mecum, Oswaldo Conroy, PA-C   10 months ago Annual physical exam   [place] [facility] Larae Grooms, NP   1 year ago Herpes simplex infection of genitourinary system   [place] [facility] Vigg, Avanti, MD   1 year 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

## 2023-09-14 ENCOUNTER — Ambulatory Visit: Payer: Self-pay | Admitting: *Deleted

## 2023-09-14 ENCOUNTER — Telehealth: Payer: 59 | Admitting: Family Medicine

## 2023-09-14 DIAGNOSIS — K0889 Other specified disorders of teeth and supporting structures: Secondary | ICD-10-CM

## 2023-09-14 MED ORDER — AMOXICILLIN 500 MG PO CAPS
500.0000 mg | ORAL_CAPSULE | Freq: Three times a day (TID) | ORAL | 0 refills | Status: AC
Start: 2023-09-14 — End: 2023-09-24

## 2023-09-14 MED ORDER — NAPROXEN 500 MG PO TABS
500.0000 mg | ORAL_TABLET | Freq: Two times a day (BID) | ORAL | 0 refills | Status: DC
Start: 2023-09-14 — End: 2024-10-09

## 2023-09-14 NOTE — Telephone Encounter (Signed)
Message from [place] C sent at 09/14/2023  2:21 PM EDT  Summary: oral discomfort / rx req   The patient would like to be prescribed an antibiotic to help with tooth discomfort in the upper right part of their mouth  The patient first began to experience discomfort on 09/13/23  The patient has taken otc medication with little to no effect  Please contact the patient further when possible          Call History  Contact Date/Time Type Contact Phone/Fax User  09/14/2023 02:20 PM EDT Phone (Incoming) My[place]le, [place] (Self) [phone] (H) Coley, [place] A   Reason for Disposition  [1] MILD-MODERATE mouth pain AND [2] present > 3 days  Answer Assessment - Initial Assessment Questions 1. ONSET: "When did the mouth start hurting?" (e.g., hours or days ago)      I can't see the dentist until in the morning.  I was wanting to see if an antibiotic could be called in   I'm taking Tylenol and ibuprofen but I'm still in a lot of pain.   2. SEVERITY: "How bad is the pain?" (Scale 1-10; mild, moderate or severe)   - MILD (1-3):  doesn't interfere with eating or normal activities   - MODERATE (4-7): interferes with eating some solids and normal activities   - SEVERE (8-10):  excruciating pain, interferes with most normal activities   - SEVERE DYSPHAGIA: can't swallow liquids, drooling     Severe pain in a tooth in my right upper mouth. 3. SORES: "Are there any sores or ulcers in the mouth?" If Yes, ask: "What part of the mouth are the sores in?"     No sores.   It's the tooth is bad. 4. FEVER: "Do you have a fever?" If Yes, ask: "What is your temperature, how was it measured, and when did it start?"     No 5. CAUSE: "What do you think is causing the mouth pain?"     Bad tooth 6. OTHER SYMPTOMS: "Do you have any other symptoms?" (e.g., difficulty breathing)     No  Protocols used: Mouth Pain-A-AH

## 2023-09-14 NOTE — Patient Instructions (Addendum)
Madolyn Frieze, thank you for joining Freddy Finner, NP for today's virtual visit.  While this provider is not your primary care provider (PCP), if your PCP is located in our provider database this encounter information will be shared with them immediately following your visit.   A [facility] MyChart account gives you access to today's visit and all your visits, tests, and labs performed at [facility] " click here if you don't have a [facility] MyChart account or go to [email or web address]  Consent: (Patient) Whitney Rodriguez provided verbal consent for this virtual visit at the beginning of the encounter.  Current Medications:  Current Outpatient Medications:    amoxicillin (AMOXIL) 500 MG capsule, Take 1 capsule (500 mg total) by mouth 3 (three) times daily for 10 days., Disp: 30 capsule, Rfl: 0   naproxen (NAPROSYN) 500 MG tablet, Take 1 tablet (500 mg total) by mouth 2 (two) times daily with a meal., Disp: 30 tablet, Rfl: 0   losartan (COZAAR) 100 MG tablet, Take 1 tablet (100 mg total) by mouth daily., Disp: 90 tablet, Rfl: 0   valACYclovir (VALTREX) 500 MG tablet, TAKE 1 TABLET (500 MG TOTAL) BY MOUTH DAILY., Disp: 90 tablet, Rfl: 1   Medications ordered in this encounter:  Meds ordered this encounter  Medications   amoxicillin (AMOXIL) 500 MG capsule    Sig: Take 1 capsule (500 mg total) by mouth 3 (three) times daily for 10 days.    Dispense:  30 capsule    Refill:  0    Order Specific Question:   Supervising Provider    Answer:   Merrilee Jansky [[id number]]   naproxen (NAPROSYN) 500 MG tablet    Sig: Take 1 tablet (500 mg total) by mouth 2 (two) times daily with a meal.    Dispense:  30 tablet    Refill:  0    Order Specific Question:   Supervising Provider    Answer:   Merrilee Jansky [id number]     *If you need refills on other medications prior to your next appointment, please contact your pharmacy*  Follow-Up: Call back or seek an in-person  evaluation if the symptoms worsen or if the condition fails to improve as anticipated.   Virtual Care [phone]  Other Instructions Dental Pain Dental pain is often a sign that something is wrong with your teeth or gums. You can also have pain after a dental treatment. If you have dental pain, it is important to contact your dentist, especially if the cause of the pain is not known. Dental pain may hurt a lot or a little and can be caused by many things, including: Tooth decay (cavities or caries). Infection. The inner part of the tooth being filled with pus (an abscess). Injury. A crack in the tooth. Gums that move back and expose the root of a tooth. Gum disease. Abnormal grinding or clenching of teeth. Not taking good care of your teeth. Sometimes the cause of pain is not known. You may have pain all the time, or it may happen only when you are: Chewing. Exposed to hot or cold temperatures. Eating or drinking foods or drinks that have a lot of sugar in them, such as soda or candy. Follow these instructions at home: Medicines Take over-the-counter and prescription medicines only as told by your dentist. If you were prescribed an antibiotic medicine, take it as told by your dentist. Do not stop taking it even if you start to feel better.  Eating and drinking Do not eat foods or drinks that cause you pain. These include: Very hot or very cold foods or drinks. Sweet or sugary foods or drinks. Managing pain and swelling  If told, put ice on the painful area of your face. To do this: Put ice in a plastic bag. Place a towel between your skin and the bag. Leave the ice on for 20 minutes, 2-3 times a day. Take off the ice if your skin turns bright red. This is very important. If you cannot feel pain, heat, or cold, you have a greater risk of damage to the area. Brushing your teeth Brush your teeth twice a day using a fluoride toothpaste. Use a toothpaste made for  sensitive teeth as told by your dentist. Use a soft toothbrush. General instructions Floss your teeth at least once a day. Do not put heat on the outside of your face. Rinse your mouth often with salt water. To make salt water, dissolve -1 tsp (3-6 g) of salt in 1 cup (237 mL) of warm water. Watch your dental pain. Let your dentist know if there are any changes. Keep all follow-up visits. Contact a dentist if: You have dental pain and you do not know why. Medicine does not help your pain. Your symptoms get worse. You have new symptoms. Get help right away if: You cannot open your mouth. You are having trouble breathing or swallowing. You have a fever. Your face, neck, or jaw is swollen. These symptoms may be an emergency. Get help right away. Call your local emergency services (911 in the [place] Do not wait to see if the symptoms will go away. Do not drive yourself to the hospital. Summary Dental pain may be caused by many things, including tooth decay, injury, or infection. In some cases, the cause is not known. Dental pain may hurt a lot or very little. You may have pain all the time, or you may have it only when you eat or drink. Take over-the-counter and prescription medicines only as told by your dentist. Watch your dental pain for any changes. Let your dentist know if symptoms get worse. This information is not intended to replace advice given to you by your health care provider. Make sure you discuss any questions you have with your health care provider. Document Revised: 09/10/2020 Document Reviewed: 09/10/2020 Elsevier Patient Education  2024 Elsevier Inc.    If you have been instructed to have an in-person evaluation today at a local Urgent Care facility, please use the link below. It will take you to a list of all of our available [facility], including address, phone number and hours of operation. Please do not delay care.  [facility]  If  you or a family member do not have a primary care provider, use the link below to schedule a visit and establish care. When you choose a [facility] primary care physician or advanced practice provider, you gain a long-term partner in health. Find a Primary Care Provider  Learn more about [facility]'s in-office and virtual care options: [facility] - Get Care Now

## 2023-09-14 NOTE — Progress Notes (Signed)
Virtual Visit Consent   Whitney Rodriguez, you are scheduled for a virtual visit with a [facility] provider today. Just as with appointments in the office, your consent must be obtained to participate. Your consent will be active for this visit and any virtual visit you may have with one of our providers in the next 365 days. If you have a MyChart account, a copy of this consent can be sent to you electronically.  As this is a virtual visit, video technology does not allow for your provider to perform a traditional examination. This may limit your provider's ability to fully assess your condition. If your provider identifies any concerns that need to be evaluated in person or the need to arrange testing (such as labs, EKG, etc.), we will make arrangements to do so. Although advances in technology are sophisticated, we cannot ensure that it will always work on either your end or our end. If the connection with a video visit is poor, the visit may have to be switched to a telephone visit. With either a video or telephone visit, we are not always able to ensure that we have a secure connection.  By engaging in this virtual visit, you consent to the provision of healthcare and authorize for your insurance to be billed (if applicable) for the services provided during this visit. Depending on your insurance coverage, you may receive a charge related to this service.  I need to obtain your verbal consent now. Are you willing to proceed with your visit today? Whitney Rodriguez has provided verbal consent on 09/14/2023 for a virtual visit (video or telephone). Freddy Finner, NP  Date: 09/14/2023 2:47 PM  Virtual Visit via Video Note   I, Freddy Finner, connected with  Whitney Rodriguez  ([id number], 03-Apr-1986) on 09/14/23 at  3:30 PM EDT by a video-enabled telemedicine application and verified that I am speaking with the correct person using two identifiers.  Location: Patient: Virtual Visit Location Patient:  Home Provider: Virtual Visit Location Provider: Home Office   I discussed the limitations of evaluation and management by telemedicine and the availability of in person appointments. The patient expressed understanding and agreed to proceed.    History of Present Illness: Whitney Rodriguez is a 37 y.o. who identifies as a female who was assigned female at birth, and is being seen today for tooth ache  Onset was had fillings done about a month and half ago, but has had hot & cold sensitivity issues ever since. Tried to call the Dentist that did the fillings- they can't see her until Thursday afternoon. She is having extreme pain 10/10 that is no longer responding to OTC meds.  Associated symptoms are having trouble chewing on that side- today around 1230 she noted an increased in pain and Tylenol was no longer helping  Modifying factors are Tylenol and ibuprofen every 3-4 hours rotating  Denies chest pain, shortness of breath, fevers, chills, no swelling or infection seen at area or felt. Trouble talking or opening mouth.    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   Erythrocytosis 09/08/2021   Ankylosing spondylitis of sacral region (HCC) 08/31/2021   Long-term use of immunosuppressant medication 08/31/2021   Abrasion 07/09/2019   Bacterial vaginosis 07/06/2019   Essential hypertension 01/17/2018   Gestational hypertension without significant proteinuria in third trimester 10/08/2013   History of tobacco  use 03/28/2013   Supervision of high risk pregnancy in third trimester 03/27/2013   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  Resting comfortably  at home.  Head is normocephalic, atraumatic.  No labored breathing.  Speech is clear and coherent with logical content.  Patient is alert and oriented at baseline.    Assessment and Plan:  1. Pain, dental  - amoxicillin (AMOXIL) 500 MG capsule; Take 1 capsule (500 mg total) by mouth 3 (three) times daily for 10 days.  Dispense: 30 capsule; Refill: 0 - naproxen (NAPROSYN) 500 MG tablet; Take 1 tablet (500 mg total) by mouth 2 (two) times daily with a meal.  Dispense: 30 tablet; Refill: 0  -follow up with dentist in the morning -use naprosyn in place of ibuprofen -warm compress on jaw line of area -Tylenol as needed  -soft diet until seen by dentist  Reviewed side effects, risks and benefits of medication.    Patient acknowledged agreement and understanding of the plan.   Past Medical, Surgical, Social History, Allergies, and Medications have been Reviewed.    Follow Up Instructions: I discussed the assessment and treatment plan with the patient. The patient was provided an opportunity to ask questions and all were answered. The patient agreed with the plan and demonstrated an understanding of the instructions.  A copy of instructions were sent to the patient via MyChart unless otherwise noted below.    The patient was advised to call back or seek an in-person evaluation if the symptoms worsen or if the condition fails to improve as anticipated.  Time:  I spent 10 minutes with the patient via telehealth technology discussing the above problems/concerns.    Freddy Finner, NP

## 2023-09-14 NOTE — Telephone Encounter (Signed)
  Chief Complaint: Has a bad tooth in her right upper mouth that is really hurting.   She has a dental appt first thing in the morning to have it fixed.  That was the soonest the dentist could get her in.   She was wanting to know if an antibiotic could be called in for her this afternoon.  I let her know she would need to be seen in order to have something prescribed.  Symptoms: Pain in a bad tooth that ibuprofen and Tylenol are not helping much. Frequency: Started hurting yesterday Pertinent Negatives: Patient denies N/A Disposition: [] ED /[x] Urgent Care (no appt availability in office) / [] Appointment(In office/virtual)/ [x]  [facility]/ [] Home Care/ [] Refused Recommended Disposition /[] [facility]/ []  Follow-up with PCP Additional Notes: There are no appts with any of the providers this afternoon and Larae Grooms, NP is out on maternity leave.  I offered her a virtual visit via MyChart or going to the urgent care since she was wanting an antibiotic today before her dental appt in the morning.   She just said,  "Ok, thank you" and ended the call.   I noticed a few minutes later that she had set up a MyChart video visit for 3:30 today.

## 2023-10-29 ENCOUNTER — Other Ambulatory Visit: Payer: Self-pay | Admitting: Physician Assistant

## 2023-10-29 ENCOUNTER — Other Ambulatory Visit: Payer: Self-pay | Admitting: Nurse Practitioner

## 2023-10-29 DIAGNOSIS — I10 Essential (primary) hypertension: Secondary | ICD-10-CM

## 2023-10-31 NOTE — Telephone Encounter (Signed)
Requested by interface surescripts. No future visit . Requested Prescriptions  Pending Prescriptions Disp Refills   valACYclovir (VALTREX) 500 MG tablet [Pharmacy Med Name: VALACYCLOVIR HCL 500 MG TABLET] 30 tablet 14    Sig: TAKE 1 TABLET (500 MG TOTAL) BY MOUTH DAILY.     Antimicrobials:  Antiviral Agents - Anti-Herpetic Passed - 10/29/2023  8:49 AM      Passed - Valid encounter within last 12 months    Recent Outpatient Visits           2 months ago Herpes simplex infection of genitourinary system   [place] [facility] Mecum, Oswaldo Conroy, PA-C   2 months ago Essential hypertension   [place] [facility] Mecum, Oswaldo Conroy, PA-C   1 year ago Annual physical exam   [place] [facility] Larae Grooms, NP   1 year ago Herpes simplex infection of genitourinary system    [facility] Vigg, Avanti, MD   1 year ago HSV-1 infection    [facility] Gerre Scull, NP

## 2023-10-31 NOTE — Telephone Encounter (Signed)
Patient is due for cpe, please call to schedule and then route to provider for refill.

## 2023-10-31 NOTE — Telephone Encounter (Signed)
Requested medications are due for refill today.  yes  Requested medications are on the active medications list.  yes  Last refill. 08/05/2023 #90 0 rf  Future visit scheduled.   no  Notes to clinic.  Expired labs    Requested Prescriptions  Pending Prescriptions Disp Refills   losartan (COZAAR) 100 MG tablet [Pharmacy Med Name: LOSARTAN POTASSIUM 100 MG TAB] 30 tablet 2    Sig: TAKE 1 TABLET BY MOUTH EVERY DAY     Cardiovascular:  Angiotensin Receptor Blockers Failed - 10/29/2023  8:52 AM      Failed - Cr in normal range and within 180 days    Creatinine, Ser  Date Value Ref Range Status  10/11/2022 0.83 0.57 - 1.00 mg/dL Final   Creatinine, Urine  Date Value Ref Range Status  10/08/2013 77.47 mg/dL Final         Failed - K in normal range and within 180 days    Potassium  Date Value Ref Range Status  10/11/2022 4.1 3.5 - 5.2 mmol/L Final         Failed - Last BP in normal range    BP Readings from Last 1 Encounters:  08/25/23 (!) 132/90         Passed - Patient is not pregnant      Passed - Valid encounter within last 6 months    Recent Outpatient Visits           2 months ago Herpes simplex infection of genitourinary system   [place] [facility] Mecum, Oswaldo Conroy, PA-C   2 months ago Essential hypertension   [place] [facility] Mecum, Oswaldo Conroy, PA-C   1 year ago Annual physical exam   [place] [facility] Larae Grooms, NP   1 year ago Herpes simplex infection of genitourinary system   [place] [facility] Vigg, Avanti, MD   1 year ago HSV-1 infection   [place] [facility], Jake Church, NP

## 2023-11-03 ENCOUNTER — Other Ambulatory Visit: Payer: Self-pay | Admitting: Nurse Practitioner

## 2023-11-03 DIAGNOSIS — I10 Essential (primary) hypertension: Secondary | ICD-10-CM

## 2023-11-03 NOTE — Telephone Encounter (Signed)
Requested medications are due for refill today.  yes  Requested medications are on the active medications list.  yes  Last refill. 08/05/2023 #90 0 rf  Future visit scheduled.   yes  Notes to clinic.  Pt is requesting enough medication to last until upcoming appt. Labs are expired.    Requested Prescriptions  Pending Prescriptions Disp Refills   losartan (COZAAR) 100 MG tablet 90 tablet 0    Sig: Take 1 tablet (100 mg total) by mouth daily.     Cardiovascular:  Angiotensin Receptor Blockers Failed - 11/03/2023  1:07 PM      Failed - Cr in normal range and within 180 days    Creatinine, Ser  Date Value Ref Range Status  10/11/2022 0.83 0.57 - 1.00 mg/dL Final   Creatinine, Urine  Date Value Ref Range Status  10/08/2013 77.47 mg/dL Final         Failed - K in normal range and within 180 days    Potassium  Date Value Ref Range Status  10/11/2022 4.1 3.5 - 5.2 mmol/L Final         Failed - Last BP in normal range    BP Readings from Last 1 Encounters:  08/25/23 (!) 132/90         Passed - Patient is not pregnant      Passed - Valid encounter within last 6 months    Recent Outpatient Visits           2 months ago Herpes simplex infection of genitourinary system   [place] [facility] Mecum, Oswaldo Conroy, PA-C   3 months ago Essential hypertension   [place] [facility] Mecum, Oswaldo Conroy, PA-C   1 year ago Annual physical exam   [place] [facility] Larae Grooms, NP   1 year ago Herpes simplex infection of genitourinary system   [place] [facility] Vigg, Avanti, MD   1 year ago HSV-1 infection   [place] [facility] Gerre Scull, NP       Future Appointments             In 2 weeks Larae Grooms, NP  [place] [facility]

## 2023-11-03 NOTE — Telephone Encounter (Signed)
Medication Refill -  Most Recent Primary Care Visit:  Provider: Jacquelin Hawking E  Department: [facility]  Visit Type: OFFICE VISIT  Date: 08/25/2023  Medication: losartan (COZAAR) 100 MG tablet   Pt asked if it was possible to get a courtesy refill to get her through until her appointment 12/02. Please advise.   Has the patient contacted their pharmacy? No (Agent: If no, request that the patient contact the pharmacy for the refill. If patient does not   Is this the correct pharmacy for this prescription? Yes If no, delete pharmacy and type the correct one.  This is the patient's preferred pharmacy:   [id number] - [place], [place] - [address] [place] [address] Phone: [phone] Fax: [phone]   Has the prescription been filled recently? No  Is the patient out of the medication? Yes  Has the patient been seen for an appointment in the last year OR does the patient have an upcoming appointment? Yes  Can we respond through MyChart? Yes  Agent: Please be advised that Rx refills may take up to 3 business days. We ask that you follow-up with your pharmacy.

## 2023-11-03 NOTE — Telephone Encounter (Signed)
Attempted to reach via the other numbers that is provided in her chart as well as sent a MyChart message, pt still has not contacted office with correct phone number or to make appointment.  Routing to provider for informational purposes.

## 2023-11-04 MED ORDER — LOSARTAN POTASSIUM 100 MG PO TABS
100.0000 mg | ORAL_TABLET | Freq: Every day | ORAL | 0 refills | Status: DC
Start: 2023-11-04 — End: 2023-11-21

## 2023-11-21 ENCOUNTER — Ambulatory Visit: Payer: 59 | Admitting: Nurse Practitioner

## 2023-11-21 ENCOUNTER — Encounter: Payer: Self-pay | Admitting: Nurse Practitioner

## 2023-11-21 VITALS — BP 130/92 | HR 106 | Temp 97.6°F | Ht 63.0 in | Wt 166.8 lb

## 2023-11-21 DIAGNOSIS — E781 Pure hyperglyceridemia: Secondary | ICD-10-CM

## 2023-11-21 DIAGNOSIS — Z0000 Encounter for general adult medical examination without abnormal findings: Secondary | ICD-10-CM

## 2023-11-21 DIAGNOSIS — I10 Essential (primary) hypertension: Secondary | ICD-10-CM

## 2023-11-21 LAB — URINALYSIS, ROUTINE W REFLEX MICROSCOPIC
Bilirubin, UA: NEGATIVE
Glucose, UA: NEGATIVE
Ketones, UA: NEGATIVE
Leukocytes,UA: NEGATIVE
Nitrite, UA: NEGATIVE
RBC, UA: NEGATIVE
Specific Gravity, UA: 1.015 (ref 1.005–1.030)
Urobilinogen, Ur: 0.2 mg/dL (ref 0.2–1.0)
pH, UA: 8.5 — ABNORMAL HIGH (ref 5.0–7.5)

## 2023-11-21 MED ORDER — LOSARTAN POTASSIUM 100 MG PO TABS
100.0000 mg | ORAL_TABLET | Freq: Every day | ORAL | 0 refills | Status: DC
Start: 2023-11-21 — End: 2024-03-02

## 2023-11-21 MED ORDER — AMLODIPINE BESYLATE 5 MG PO TABS: 5.0000 mg | ORAL_TABLET | Freq: Every day | ORAL | 0 refills | Status: DC

## 2023-11-21 NOTE — Assessment & Plan Note (Signed)
Labs ordered at visit today.  Will make recommendations based on lab results.   

## 2023-11-21 NOTE — Assessment & Plan Note (Signed)
Chronic. Not well controlled.  Will continue Losartan 100mg  and add Amlodipine.  Side effects and benefits of medication discussed during visit.  Labs ordered.  Recommend smoking cessation.  Follow up in 6 weeks for reevaluation.  Call sooner if concerns arise.

## 2023-11-21 NOTE — Progress Notes (Signed)
BP (!) 135/94 (BP Location: Left Arm, Patient Position: Sitting, Cuff Size: Normal)   Pulse (!) 106   Temp 97.6 F (36.4 C) (Oral)   Ht 5\' 3"  (1.6 m)   Wt 166 lb 12.8 oz (75.7 kg)   SpO2 98%   BMI 29.55 kg/m    Subjective:    Patient ID: Whitney Rodriguez, female    DOB: 01/27/1986, 37 y.o.   MRN: [id number]  HPI: Whitney Rodriguez is a 37 y.o. female presenting on 11/21/2023 for comprehensive medical examination. Current medical complaints include: bp  She currently lives with: Menopausal Symptoms: no  HYPERTENSION with Chronic Kidney Disease Hypertension status: uncontrolled  Satisfied with current treatment? no Duration of hypertension: years BP monitoring frequency:   BP range:  BP medication side effects:  no Medication compliance: excellent compliance Previous BP meds:losartan (cozaar) Aspirin: no Recurrent headaches: no Visual changes: no Palpitations: no Dyspnea: no Chest pain: no Lower extremity edema: no Dizzy/lightheaded: no    Depression Screen done today and results listed below:     11/21/2023    9:00 AM 08/25/2023   11:09 AM 08/05/2023    3:35 PM 10/11/2022    1:26 PM 03/01/2022    8:35 AM  Depression screen PHQ 2/9  Decreased Interest 0 0 0 0 0  Down, Depressed, Hopeless 0 0 0 0 0  PHQ - 2 Score 0 0 0 0 0  Altered sleeping 0 0 0 0 0  Tired, decreased energy 0 0 0 0 0  Change in appetite 0 0 0 0 0  Feeling bad or failure about yourself  0 0 0 0 0  Trouble concentrating 0 0 0 0 0  Moving slowly or fidgety/restless 0 0 0 0 0  Suicidal thoughts 0 0 0 0 0  PHQ-9 Score 0 0 0 0 0  Difficult doing work/chores  Not difficult at all Not difficult at all Not difficult at all Not difficult at all    The patient does not have a history of falls. I did complete a risk assessment for falls. A plan of care for falls was documented.   Past Medical History:  Past Medical History:  Diagnosis Date   Cystic fibrosis gene carrier    fob is negative for gene    Herpes    Hypertension    Medical history non-contributory     Surgical History:  Past Surgical History:  Procedure Laterality Date   TUBAL LIGATION Bilateral 10/09/2013   Procedure: POST PARTUM TUBAL LIGATION;  Surgeon: Willodean Rosenthal, MD;  Location: [facility];  Service: Gynecology;  Laterality: Bilateral;    Medications:  Current Outpatient Medications on File Prior to Visit  Medication Sig   losartan (COZAAR) 100 MG tablet Take 1 tablet (100 mg total) by mouth daily.   naproxen (NAPROSYN) 500 MG tablet Take 1 tablet (500 mg total) by mouth 2 (two) times daily with a meal.   valACYclovir (VALTREX) 500 MG tablet TAKE 1 TABLET (500 MG TOTAL) BY MOUTH DAILY.   No current facility-administered medications on file prior to visit.    Allergies:  Allergies  Allergen Reactions   Lisinopril Cough    Social History:  Social History   Socioeconomic History   Marital status: Single    Spouse name: Not on file   Number of children: Not on file   Years of education: Not on file   Highest education level: Not on file  Occupational History   Not on file  Tobacco Use  Smoking status: Every Day    Current packs/day: 0.25    Average packs/day: 0.3 packs/day for 7.0 years (1.8 ttl pk-yrs)    Types: Cigarettes   Smokeless tobacco: Never  Vaping Use   Vaping status: Some Days  Substance and Sexual Activity   Alcohol use: Yes    Alcohol/week: 2.0 standard drinks of alcohol    Types: 1 Glasses of wine, 1 Cans of beer per week    Comment: Using electronic cigarette rarely at this point/ pt is social drinker when not pregnant.   Drug use: No   Sexual activity: Yes    Partners: Male    Birth control/protection: Surgical  Other Topics Concern   Not on file  Social History Narrative   Not on file   Social Determinants of Health   Financial Resource Strain: Not on file  Food Insecurity: Not on file  Transportation Needs: Not on file  Physical Activity: Not on file   Stress: Not on file  Social Connections: Not on file  Intimate Partner Violence: Not on file   Social History   Tobacco Use  Smoking Status Every Day   Current packs/day: 0.25   Average packs/day: 0.3 packs/day for 7.0 years (1.8 ttl pk-yrs)   Types: Cigarettes  Smokeless Tobacco Never   Social History   Substance and Sexual Activity  Alcohol Use Yes   Alcohol/week: 2.0 standard drinks of alcohol   Types: 1 Glasses of wine, 1 Cans of beer per week   Comment: Using electronic cigarette rarely at this point/ pt is social drinker when not pregnant.    Family History:  Family History  Problem Relation Age of Onset   Hypertension Father     Past medical history, surgical history, medications, allergies, family history and social history reviewed with patient today and changes made to appropriate areas of the chart.   Review of Systems  Eyes:  Negative for blurred vision and double vision.  Respiratory:  Negative for shortness of breath.   Cardiovascular:  Negative for chest pain, palpitations and leg swelling.  Neurological:  Negative for dizziness and headaches.   All other ROS negative except what is listed above and in the HPI.      Objective:    BP (!) 135/94 (BP Location: Left Arm, Patient Position: Sitting, Cuff Size: Normal)   Pulse (!) 106   Temp 97.6 F (36.4 C) (Oral)   Ht 5\' 3"  (1.6 m)   Wt 166 lb 12.8 oz (75.7 kg)   SpO2 98%   BMI 29.55 kg/m   Wt Readings from Last 3 Encounters:  11/21/23 166 lb 12.8 oz (75.7 kg)  08/25/23 165 lb 3.2 oz (74.9 kg)  08/05/23 166 lb (75.3 kg)    Physical Exam Vitals and nursing note reviewed.  Constitutional:      General: She is awake. She is not in acute distress.    Appearance: Normal appearance. She is well-developed and normal weight. She is not ill-appearing.  HENT:     Head: Normocephalic and atraumatic.     Right Ear: Hearing, tympanic membrane, ear canal and external ear normal. No drainage.     Left Ear:  Hearing, tympanic membrane, ear canal and external ear normal. No drainage.     Nose: Nose normal.     Right Sinus: No maxillary sinus tenderness or frontal sinus tenderness.     Left Sinus: No maxillary sinus tenderness or frontal sinus tenderness.     Mouth/Throat:  Mouth: Mucous membranes are moist.     Pharynx: Oropharynx is clear. Uvula midline. No pharyngeal swelling, oropharyngeal exudate or posterior oropharyngeal erythema.  Eyes:     General: Lids are normal.        Right eye: No discharge.        Left eye: No discharge.     Extraocular Movements: Extraocular movements intact.     Conjunctiva/sclera: Conjunctivae normal.     Pupils: Pupils are equal, round, and reactive to light.     Visual Fields: Right eye visual fields normal and left eye visual fields normal.  Neck:     Thyroid: No thyromegaly.     Vascular: No carotid bruit.     Trachea: Trachea normal.  Cardiovascular:     Rate and Rhythm: Normal rate and regular rhythm.     Heart sounds: Normal heart sounds. No murmur heard.    No gallop.  Pulmonary:     Effort: Pulmonary effort is normal. No accessory muscle usage or respiratory distress.     Breath sounds: Normal breath sounds.  Chest:  Breasts:    Right: Normal.     Left: Normal.  Abdominal:     General: Bowel sounds are normal.     Palpations: Abdomen is soft. There is no hepatomegaly or splenomegaly.     Tenderness: There is no abdominal tenderness.  Musculoskeletal:        General: Normal range of motion.     Cervical back: Normal range of motion and neck supple.     Right lower leg: No edema.     Left lower leg: No edema.  Lymphadenopathy:     Head:     Right side of head: No submental, submandibular, tonsillar, preauricular or posterior auricular adenopathy.     Left side of head: No submental, submandibular, tonsillar, preauricular or posterior auricular adenopathy.     Cervical: No cervical adenopathy.     Upper Body:     Right upper body: No  supraclavicular, axillary or pectoral adenopathy.     Left upper body: No supraclavicular, axillary or pectoral adenopathy.  Skin:    General: Skin is warm and dry.     Capillary Refill: Capillary refill takes less than 2 seconds.     Findings: No rash.  Neurological:     Mental Status: She is alert and oriented to person, place, and time.     Gait: Gait is intact.     Deep Tendon Reflexes: Reflexes are normal and symmetric.     Reflex Scores:      Brachioradialis reflexes are 2+ on the right side and 2+ on the left side.      Patellar reflexes are 2+ on the right side and 2+ on the left side. Psychiatric:        Attention and Perception: Attention normal.        Mood and Affect: Mood normal.        Speech: Speech normal.        Behavior: Behavior normal. Behavior is cooperative.        Thought Content: Thought content normal.        Judgment: Judgment normal.     Results for orders placed or performed in visit on 10/11/22  Microscopic Examination   Urine  Result Value Ref Range   WBC, UA 6-10 (A) 0 - 5 /hpf   RBC, Urine 0-2 0 - 2 /hpf   Epithelial Cells (non renal) 0-10 0 - 10 /hpf  Bacteria, UA Few None seen/Few  CBC with Differential/Platelet  Result Value Ref Range   WBC 8.6 3.4 - 10.8 x10E3/uL   RBC 4.86 3.77 - 5.28 x10E6/uL   Hemoglobin 16.5 (H) 11.1 - 15.9 g/dL   Hematocrit 60.4 (H) 54.0 - 46.6 %   MCV 99 (H) 79 - 97 fL   MCH 34.0 (H) 26.6 - 33.0 pg   MCHC 34.2 31.5 - 35.7 g/dL   RDW 98.1 19.1 - 47.8 %   Platelets 293 150 - 450 x10E3/uL   Neutrophils 70 Not Estab. %   Lymphs 21 Not Estab. %   Monocytes 6 Not Estab. %   Eos 2 Not Estab. %   Basos 1 Not Estab. %   Neutrophils Absolute 6.0 1.4 - 7.0 x10E3/uL   Lymphocytes Absolute 1.9 0.7 - 3.1 x10E3/uL   Monocytes Absolute 0.5 0.1 - 0.9 x10E3/uL   EOS (ABSOLUTE) 0.2 0.0 - 0.4 x10E3/uL   Basophils Absolute 0.1 0.0 - 0.2 x10E3/uL   Immature Granulocytes 0 Not Estab. %   Immature Grans (Abs) 0.0 0.0 - 0.1  x10E3/uL  Comprehensive metabolic panel  Result Value Ref Range   Glucose 87 70 - 99 mg/dL   BUN 7 6 - 20 mg/dL   Creatinine, Ser 2.95 0.57 - 1.00 mg/dL   eGFR 92 >62 ZH/YQM/5.78   BUN/Creatinine Ratio 8 (L) 9 - 23   Sodium 140 134 - 144 mmol/L   Potassium 4.1 3.5 - 5.2 mmol/L   Chloride 101 96 - 106 mmol/L   CO2 22 20 - 29 mmol/L   Calcium 9.5 8.7 - 10.2 mg/dL   Total Protein 6.9 6.0 - 8.5 g/dL   Albumin 4.5 3.9 - 4.9 g/dL   Globulin, Total 2.4 1.5 - 4.5 g/dL   Albumin/Globulin Ratio 1.9 1.2 - 2.2   Bilirubin Total 0.8 0.0 - 1.2 mg/dL   Alkaline Phosphatase 77 44 - 121 IU/L   AST 14 0 - 40 IU/L   ALT 11 0 - 32 IU/L  Lipid panel  Result Value Ref Range   Cholesterol, Total 132 100 - 199 mg/dL   Triglycerides 469 0 - 149 mg/dL   HDL 54 >62 mg/dL   VLDL Cholesterol Cal 20 5 - 40 mg/dL   LDL Chol Calc (NIH) 58 0 - 99 mg/dL   Chol/HDL Ratio 2.4 0.0 - 4.4 ratio  TSH  Result Value Ref Range   TSH 0.926 0.450 - 4.500 uIU/mL  Urinalysis, Routine w reflex microscopic  Result Value Ref Range   Specific Gravity, UA 1.020 1.005 - 1.030   pH, UA 7.0 5.0 - 7.5   Color, UA Yellow Yellow   Appearance Ur Clear Clear   Leukocytes,UA 1+ (A) Negative   Protein,UA Trace (A) Negative/Trace   Glucose, UA Negative Negative   Ketones, UA Trace (A) Negative   RBC, UA Negative Negative   Bilirubin, UA Negative Negative   Urobilinogen, Ur 1.0 0.2 - 1.0 mg/dL   Nitrite, UA Negative Negative   Microscopic Examination See below:   Hepatitis C Antibody  Result Value Ref Range   Hep C Virus Ab Non Reactive Non Reactive      Assessment & Plan:   Problem List Items Addressed This Visit       Cardiovascular and Mediastinum   Essential hypertension     Other   Hypertriglyceridemia   Other Visit Diagnoses     Annual physical exam    -  Primary  Follow up plan: No follow-ups on file.   LABORATORY TESTING:  - Pap smear:  Will do at next visit.   IMMUNIZATIONS:   - Tdap:  Tetanus vaccination status reviewed: last tetanus booster within 10 years. - Influenza: up to date - Pneumovax: Refused - Prevnar: Not applicable - COVID: Not applicable - HPV: Not applicable - Shingrix vaccine: Not applicable  SCREENING: -Mammogram: Not applicable  - Colonoscopy: Not applicable  - Bone Density: Not applicable  -Hearing Test: Not applicable  -Spirometry: Not applicable   PATIENT COUNSELING:   Advised to take 1 mg of folate supplement per day if capable of pregnancy.   Sexuality: Discussed sexually transmitted diseases, partner selection, use of condoms, avoidance of unintended pregnancy  and contraceptive alternatives.   Advised to avoid cigarette smoking.  I discussed with the patient that most people either abstain from alcohol or drink within safe limits (<=14/week and <=4 drinks/occasion for males, <=7/weeks and <= 3 drinks/occasion for females) and that the risk for alcohol disorders and other health effects rises proportionally with the number of drinks per week and how often a drinker exceeds daily limits.  Discussed cessation/primary prevention of drug use and availability of treatment for abuse.   Diet: Encouraged to adjust caloric intake to maintain  or achieve ideal body weight, to reduce intake of dietary saturated fat and total fat, to limit sodium intake by avoiding high sodium foods and not adding table salt, and to maintain adequate dietary potassium and calcium preferably from fresh fruits, vegetables, and low-fat dairy products.    stressed the importance of regular exercise  Injury prevention: Discussed safety belts, safety helmets, smoke detector, smoking near bedding or upholstery.   Dental health: Discussed importance of regular tooth brushing, flossing, and dental visits.    NEXT PREVENTATIVE PHYSICAL DUE IN 1 YEAR. No follow-ups on file.

## 2023-11-22 LAB — COMPREHENSIVE METABOLIC PANEL
ALT: 14 [IU]/L (ref 0–32)
AST: 20 [IU]/L (ref 0–40)
Albumin: 4.3 g/dL (ref 3.9–4.9)
Alkaline Phosphatase: 99 [IU]/L (ref 44–121)
BUN/Creatinine Ratio: 9 (ref 9–23)
BUN: 7 mg/dL (ref 6–20)
Bilirubin Total: 0.6 mg/dL (ref 0.0–1.2)
CO2: 24 mmol/L (ref 20–29)
Calcium: 9.4 mg/dL (ref 8.7–10.2)
Chloride: 98 mmol/L (ref 96–106)
Creatinine, Ser: 0.75 mg/dL (ref 0.57–1.00)
Globulin, Total: 2.8 g/dL (ref 1.5–4.5)
Glucose: 86 mg/dL (ref 70–99)
Potassium: 4.1 mmol/L (ref 3.5–5.2)
Sodium: 137 mmol/L (ref 134–144)
Total Protein: 7.1 g/dL (ref 6.0–8.5)
eGFR: 104 mL/min/{1.73_m2} (ref >59)

## 2023-11-22 LAB — CBC WITH DIFFERENTIAL/PLATELET
Basophils Absolute: 0.1 10*3/uL (ref 0.0–0.2)
Basos: 1 %
EOS (ABSOLUTE): 0.1 10*3/uL (ref 0.0–0.4)
Eos: 1 %
Hematocrit: 53.7 % — ABNORMAL HIGH (ref 34.0–46.6)
Hemoglobin: 18 g/dL — ABNORMAL HIGH (ref 11.1–15.9)
Immature Grans (Abs): 0.1 10*3/uL (ref 0.0–0.1)
Immature Granulocytes: 1 %
Lymphocytes Absolute: 1.8 10*3/uL (ref 0.7–3.1)
Lymphs: 20 %
MCH: 34.7 pg — ABNORMAL HIGH (ref 26.6–33.0)
MCHC: 33.5 g/dL (ref 31.5–35.7)
MCV: 104 fL — ABNORMAL HIGH (ref 79–97)
Monocytes Absolute: 0.5 10*3/uL (ref 0.1–0.9)
Monocytes: 5 %
Neutrophils Absolute: 6.6 10*3/uL (ref 1.4–7.0)
Neutrophils: 72 %
Platelets: 290 10*3/uL (ref 150–450)
RBC: 5.19 x10E6/uL (ref 3.77–5.28)
RDW: 12.6 % (ref 11.7–15.4)
WBC: 9.1 10*3/uL (ref 3.4–10.8)

## 2023-11-22 LAB — LIPID PANEL
Chol/HDL Ratio: 3.2 {ratio} (ref 0.0–4.4)
Cholesterol, Total: 144 mg/dL (ref 100–199)
HDL: 45 mg/dL (ref >39)
LDL Chol Calc (NIH): 77 mg/dL (ref 0–99)
Triglycerides: 125 mg/dL (ref 0–149)
VLDL Cholesterol Cal: 22 mg/dL (ref 5–40)

## 2023-11-22 LAB — TSH: TSH: 1.22 u[IU]/mL (ref 0.450–4.500)

## 2024-03-02 ENCOUNTER — Other Ambulatory Visit: Payer: Self-pay | Admitting: Nurse Practitioner

## 2024-03-02 DIAGNOSIS — I10 Essential (primary) hypertension: Secondary | ICD-10-CM

## 2024-03-02 NOTE — Telephone Encounter (Signed)
 Requested Prescriptions  Pending Prescriptions Disp Refills   losartan (COZAAR) 100 MG tablet [Pharmacy Med Name: LOSARTAN POTASSIUM 100 MG TAB] 90 tablet 0    Sig: TAKE 1 TABLET BY MOUTH EVERY DAY     Cardiovascular:  Angiotensin Receptor Blockers Failed - 03/02/2024  2:16 PM      Failed - Last BP in normal range    BP Readings from Last 1 Encounters:  11/21/23 (!) 130/92         Passed - Cr in normal range and within 180 days    Creatinine, Ser  Date Value Ref Range Status  11/21/2023 0.75 0.57 - 1.00 mg/dL Final   Creatinine, Urine  Date Value Ref Range Status  10/08/2013 77.47 mg/dL Final         Passed - K in normal range and within 180 days    Potassium  Date Value Ref Range Status  11/21/2023 4.1 3.5 - 5.2 mmol/L Final         Passed - Patient is not pregnant      Passed - Valid encounter within last 6 months    Recent Outpatient Visits           3 months ago Annual physical exam   [place] [facility] Larae Grooms, NP   6 months ago Herpes simplex infection of genitourinary system   [place] [facility] Mecum, Oswaldo Conroy, PA-C   7 months ago Essential hypertension   [place] [facility] Mecum, Oswaldo Conroy, PA-C   1 year ago Annual physical exam   [place] [facility] Larae Grooms, NP   2 years ago Herpes simplex infection of genitourinary system   [place] [facility] Vigg, Roma Schanz, MD

## 2024-05-29 ENCOUNTER — Other Ambulatory Visit: Payer: Self-pay | Admitting: Nurse Practitioner

## 2024-05-29 DIAGNOSIS — I10 Essential (primary) hypertension: Secondary | ICD-10-CM

## 2024-05-29 NOTE — Telephone Encounter (Signed)
 Requested Prescriptions  Pending Prescriptions Disp Refills   losartan  (COZAAR ) 100 MG tablet [Pharmacy Med Name: LOSARTAN  POTASSIUM 100 MG TAB] 90 tablet 0    Sig: TAKE 1 TABLET BY MOUTH EVERY DAY     Cardiovascular:  Angiotensin Receptor Blockers Failed - 05/29/2024  4:01 PM      Failed - Cr in normal range and within 180 days    Creatinine, Ser  Date Value Ref Range Status  11/21/2023 0.75 0.57 - 1.00 mg/dL Final   Creatinine, Urine  Date Value Ref Range Status  10/08/2013 77.47 mg/dL Final         Failed - K in normal range and within 180 days    Potassium  Date Value Ref Range Status  11/21/2023 4.1 3.5 - 5.2 mmol/L Final         Failed - Last BP in normal range    BP Readings from Last 1 Encounters:  11/21/23 (!) 130/92         Failed - Valid encounter within last 6 months    Recent Outpatient Visits   None            Passed - Patient is not pregnant

## 2024-08-26 ENCOUNTER — Other Ambulatory Visit: Payer: Self-pay | Admitting: Nurse Practitioner

## 2024-08-26 DIAGNOSIS — I10 Essential (primary) hypertension: Secondary | ICD-10-CM

## 2024-08-27 NOTE — Telephone Encounter (Signed)
 Per OV of 11/2023 - pt is to RTC in 1 year. Courtesy refill. Patient will need an office visit for additional refills.  Requested Prescriptions  Pending Prescriptions Disp Refills   losartan  (COZAAR ) 100 MG tablet [Pharmacy Med Name: LOSARTAN  POTASSIUM 100 MG TAB] 30 tablet 0    Sig: TAKE 1 TABLET BY MOUTH EVERY DAY     Cardiovascular:  Angiotensin Receptor Blockers Failed - 08/27/2024  5:22 PM      Failed - Cr in normal range and within 180 days    Creatinine, Ser  Date Value Ref Range Status  11/21/2023 0.75 0.57 - 1.00 mg/dL Final   Creatinine, Urine  Date Value Ref Range Status  10/08/2013 77.47 mg/dL Final         Failed - K in normal range and within 180 days    Potassium  Date Value Ref Range Status  11/21/2023 4.1 3.5 - 5.2 mmol/L Final         Failed - Last BP in normal range    BP Readings from Last 1 Encounters:  11/21/23 (!) 130/92         Failed - Valid encounter within last 6 months    Recent Outpatient Visits   None            Passed - Patient is not pregnant

## 2024-09-22 ENCOUNTER — Other Ambulatory Visit: Payer: Self-pay | Admitting: Nurse Practitioner

## 2024-09-22 DIAGNOSIS — I10 Essential (primary) hypertension: Secondary | ICD-10-CM

## 2024-09-24 NOTE — Telephone Encounter (Signed)
 Requested medications are due for refill today.  yes  Requested medications are on the active medications list.  yes  Last refill. 08/27/2024 #30 0 rf  Future visit scheduled.   no  Notes to clinic.  Labs are expired. Per OV of 11/2023 - pt is to RTC in 1 year. Pt given a courtesy refill. 08/27/2024   Requested Prescriptions  Pending Prescriptions Disp Refills   losartan  (COZAAR ) 100 MG tablet [Pharmacy Med Name: LOSARTAN  POTASSIUM 100 MG TAB] 90 tablet 1    Sig: TAKE 1 TABLET BY MOUTH EVERY DAY     Cardiovascular:  Angiotensin Receptor Blockers Failed - 09/24/2024  4:06 PM      Failed - Cr in normal range and within 180 days    Creatinine, Ser  Date Value Ref Range Status  11/21/2023 0.75 0.57 - 1.00 mg/dL Final   Creatinine, Urine  Date Value Ref Range Status  10/08/2013 77.47 mg/dL Final         Failed - K in normal range and within 180 days    Potassium  Date Value Ref Range Status  11/21/2023 4.1 3.5 - 5.2 mmol/L Final         Failed - Last BP in normal range    BP Readings from Last 1 Encounters:  11/21/23 (!) 130/92         Failed - Valid encounter within last 6 months    Recent Outpatient Visits   None            Passed - Patient is not pregnant

## 2024-09-25 ENCOUNTER — Other Ambulatory Visit: Payer: Self-pay | Admitting: Nurse Practitioner

## 2024-09-25 DIAGNOSIS — I10 Essential (primary) hypertension: Secondary | ICD-10-CM

## 2024-09-26 NOTE — Telephone Encounter (Signed)
 Needs appointment- courtesy refill already given. Requested Prescriptions  Refused Prescriptions Disp Refills   losartan  (COZAAR ) 100 MG tablet [Pharmacy Med Name: LOSARTAN  POTASSIUM 100 MG TAB] 30 tablet 0    Sig: TAKE 1 TABLET BY MOUTH EVERY DAY     Cardiovascular:  Angiotensin Receptor Blockers Failed - 09/26/2024  1:54 PM      Failed - Cr in normal range and within 180 days    Creatinine, Ser  Date Value Ref Range Status  11/21/2023 0.75 0.57 - 1.00 mg/dL Final   Creatinine, Urine  Date Value Ref Range Status  10/08/2013 77.47 mg/dL Final         Failed - K in normal range and within 180 days    Potassium  Date Value Ref Range Status  11/21/2023 4.1 3.5 - 5.2 mmol/L Final         Failed - Last BP in normal range    BP Readings from Last 1 Encounters:  11/21/23 (!) 130/92         Failed - Valid encounter within last 6 months    Recent Outpatient Visits   None            Passed - Patient is not pregnant

## 2024-10-09 ENCOUNTER — Encounter: Payer: Self-pay | Admitting: Nurse Practitioner

## 2024-10-09 ENCOUNTER — Ambulatory Visit (INDEPENDENT_AMBULATORY_CARE_PROVIDER_SITE_OTHER): Admitting: Nurse Practitioner

## 2024-10-09 VITALS — BP 171/108 | HR 81 | Temp 98.1°F | Ht 63.0 in | Wt 159.2 lb

## 2024-10-09 DIAGNOSIS — E781 Pure hyperglyceridemia: Secondary | ICD-10-CM | POA: Diagnosis not present

## 2024-10-09 DIAGNOSIS — I10 Essential (primary) hypertension: Secondary | ICD-10-CM | POA: Diagnosis not present

## 2024-10-09 MED ORDER — AMLODIPINE-OLMESARTAN 10-40 MG PO TABS: 1.0000 | ORAL_TABLET | Freq: Every day | ORAL | 0 refills | Status: DC

## 2024-10-09 NOTE — Assessment & Plan Note (Signed)
 Chronic. Not well controlled.  Will change Losartan  to Olmesartan 40mg .  Will add Amlodipine  10mg  daily.  Side effects and benefits of medication discussed.  Follow up in 1 month.  Call sooner if concerns arise.

## 2024-10-09 NOTE — Progress Notes (Signed)
 BP (!) 171/108   Pulse 81   Temp 98.1 F (36.7 C) (Oral)   Ht 5' 3 (1.6 m)   Wt 159 lb 3.2 oz (72.2 kg)   LMP 10/08/2024 (Exact Date)   SpO2 98%   BMI 28.20 kg/m    Subjective:    Patient ID: Whitney Rodriguez, female    DOB: 21-Jul-1986, 38 y.o.   MRN: [id number]  HPI: Whitney Rodriguez is a 38 y.o. female  Chief Complaint  Patient presents with   Hypertension    Patient has been out of medication for the last 3 to 4 days   HYPERTENSION with Chronic Kidney Disease Patient states she has been out of her losartan  for the last 3-4 days.  States she is exercising more.   Hypertension status: uncontrolled  Satisfied with current treatment? no Duration of hypertension: years BP monitoring frequency:  not checking BP range:  BP medication side effects:  no Medication compliance: excellent compliance Previous BP meds:losartan  (cozaar ) Aspirin: no Recurrent headaches: no Visual changes: no Palpitations: no Dyspnea: no Chest pain: no Lower extremity edema: no Dizzy/lightheaded: no   Relevant past medical, surgical, family and social history reviewed and updated as indicated. Interim medical history since our last visit reviewed. Allergies and medications reviewed and updated.  Review of Systems  Eyes:  Negative for visual disturbance.  Respiratory:  Negative for cough, chest tightness and shortness of breath.   Cardiovascular:  Negative for chest pain, palpitations and leg swelling.  Neurological:  Negative for dizziness and headaches.    Per HPI unless specifically indicated above     Objective:    BP (!) 171/108   Pulse 81   Temp 98.1 F (36.7 C) (Oral)   Ht 5' 3 (1.6 m)   Wt 159 lb 3.2 oz (72.2 kg)   LMP 10/08/2024 (Exact Date)   SpO2 98%   BMI 28.20 kg/m   Wt Readings from Last 3 Encounters:  10/09/24 159 lb 3.2 oz (72.2 kg)  11/21/23 166 lb 12.8 oz (75.7 kg)  08/25/23 165 lb 3.2 oz (74.9 kg)    Physical Exam Vitals and nursing note reviewed.   Constitutional:      General: She is not in acute distress.    Appearance: Normal appearance. She is normal weight. She is not ill-appearing, toxic-appearing or diaphoretic.  HENT:     Head: Normocephalic.     Right Ear: External ear normal.     Left Ear: External ear normal.     Nose: Nose normal.     Mouth/Throat:     Mouth: Mucous membranes are moist.     Pharynx: Oropharynx is clear.  Eyes:     General:        Right eye: No discharge.        Left eye: No discharge.     Extraocular Movements: Extraocular movements intact.     Conjunctiva/sclera: Conjunctivae normal.     Pupils: Pupils are equal, round, and reactive to light.  Cardiovascular:     Rate and Rhythm: Normal rate and regular rhythm.     Heart sounds: No murmur heard. Pulmonary:     Effort: Pulmonary effort is normal. No respiratory distress.     Breath sounds: Normal breath sounds. No wheezing or rales.  Musculoskeletal:     Cervical back: Normal range of motion and neck supple.  Skin:    General: Skin is warm and dry.     Capillary Refill: Capillary refill takes less than 2  seconds.  Neurological:     General: No focal deficit present.     Mental Status: She is alert and oriented to person, place, and time. Mental status is at baseline.  Psychiatric:        Mood and Affect: Mood normal.        Behavior: Behavior normal.        Thought Content: Thought content normal.        Judgment: Judgment normal.     Results for orders placed or performed in visit on 11/21/23  Urinalysis, Routine w reflex microscopic   Collection Time: 11/21/23  9:18 AM  Result Value Ref Range   Specific Gravity, UA 1.015 1.005 - 1.030   pH, UA 8.5 (H) 5.0 - 7.5   Color, UA Yellow Yellow   Appearance Ur Clear Clear   Leukocytes,UA Negative Negative   Protein,UA Trace (A) Negative/Trace   Glucose, UA Negative Negative   Ketones, UA Negative Negative   RBC, UA Negative Negative   Bilirubin, UA Negative Negative   Urobilinogen,  Ur 0.2 0.2 - 1.0 mg/dL   Nitrite, UA Negative Negative   Microscopic Examination Comment   CBC with Differential/Platelet   Collection Time: 11/21/23  9:21 AM  Result Value Ref Range   WBC 9.1 3.4 - 10.8 x10E3/uL   RBC 5.19 3.77 - 5.28 x10E6/uL   Hemoglobin 18.0 (H) 11.1 - 15.9 g/dL   Hematocrit 46.2 (H) 65.9 - 46.6 %   MCV 104 (H) 79 - 97 fL   MCH 34.7 (H) 26.6 - 33.0 pg   MCHC 33.5 31.5 - 35.7 g/dL   RDW 87.3 88.2 - 84.5 %   Platelets 290 150 - 450 x10E3/uL   Neutrophils 72 Not Estab. %   Lymphs 20 Not Estab. %   Monocytes 5 Not Estab. %   Eos 1 Not Estab. %   Basos 1 Not Estab. %   Neutrophils Absolute 6.6 1.4 - 7.0 x10E3/uL   Lymphocytes Absolute 1.8 0.7 - 3.1 x10E3/uL   Monocytes Absolute 0.5 0.1 - 0.9 x10E3/uL   EOS (ABSOLUTE) 0.1 0.0 - 0.4 x10E3/uL   Basophils Absolute 0.1 0.0 - 0.2 x10E3/uL   Immature Granulocytes 1 Not Estab. %   Immature Grans (Abs) 0.1 0.0 - 0.1 x10E3/uL  Comprehensive metabolic panel   Collection Time: 11/21/23  9:21 AM  Result Value Ref Range   Glucose 86 70 - 99 mg/dL   BUN 7 6 - 20 mg/dL   Creatinine, Ser 9.24 0.57 - 1.00 mg/dL   eGFR 895 >40 fO/fpw/8.26   BUN/Creatinine Ratio 9 9 - 23   Sodium 137 134 - 144 mmol/L   Potassium 4.1 3.5 - 5.2 mmol/L   Chloride 98 96 - 106 mmol/L   CO2 24 20 - 29 mmol/L   Calcium 9.4 8.7 - 10.2 mg/dL   Total Protein 7.1 6.0 - 8.5 g/dL   Albumin 4.3 3.9 - 4.9 g/dL   Globulin, Total 2.8 1.5 - 4.5 g/dL   Bilirubin Total 0.6 0.0 - 1.2 mg/dL   Alkaline Phosphatase 99 44 - 121 IU/L   AST 20 0 - 40 IU/L   ALT 14 0 - 32 IU/L  Lipid panel   Collection Time: 11/21/23  9:21 AM  Result Value Ref Range   Cholesterol, Total 144 100 - 199 mg/dL   Triglycerides 874 0 - 149 mg/dL   HDL 45 >60 mg/dL   VLDL Cholesterol Cal 22 5 - 40 mg/dL   LDL Chol Calc (  NIH) 77 0 - 99 mg/dL   Chol/HDL Ratio 3.2 0.0 - 4.4 ratio  TSH   Collection Time: 11/21/23  9:21 AM  Result Value Ref Range   TSH 1.220 0.450 - 4.500 uIU/mL       Assessment & Plan:   Problem List Items Addressed This Visit       Cardiovascular and Mediastinum   Essential hypertension - Primary   Chronic. Not well controlled.  Will change Losartan  to Olmesartan 40mg .  Will add Amlodipine  10mg  daily.  Side effects and benefits of medication discussed.  Follow up in 1 month.  Call sooner if concerns arise.      Relevant Medications   amLODipine -olmesartan (AZOR) 10-40 MG tablet   Other Relevant Orders   Comprehensive metabolic panel with GFR     Other   Hypertriglyceridemia   Relevant Medications   amLODipine -olmesartan (AZOR) 10-40 MG tablet   Other Relevant Orders   Lipid panel     Follow up plan: Return in about 1 month (around 11/09/2024) for BP Check.

## 2024-10-10 ENCOUNTER — Ambulatory Visit: Payer: Self-pay | Admitting: Nurse Practitioner

## 2024-10-10 LAB — COMPREHENSIVE METABOLIC PANEL WITH GFR
ALT: 7 IU/L (ref 0–32)
AST: 13 IU/L (ref 0–40)
Albumin: 4.2 g/dL (ref 3.9–4.9)
Alkaline Phosphatase: 84 IU/L (ref 41–116)
BUN/Creatinine Ratio: 9 (ref 9–23)
BUN: 7 mg/dL (ref 6–24)
Bilirubin Total: 0.6 mg/dL (ref 0.0–1.2)
CO2: 23 mmol/L (ref 20–29)
Calcium: 8.9 mg/dL (ref 8.7–10.2)
Chloride: 102 mmol/L (ref 96–106)
Creatinine, Ser: 0.76 mg/dL (ref 0.57–1.00)
Globulin, Total: 2.6 g/dL (ref 1.5–4.5)
Glucose: 78 mg/dL (ref 70–99)
Potassium: 4.2 mmol/L (ref 3.5–5.2)
Sodium: 139 mmol/L (ref 134–144)
Total Protein: 6.8 g/dL (ref 6.0–8.5)
eGFR: 102 mL/min/1.73 (ref >59)

## 2024-10-10 LAB — LIPID PANEL
Chol/HDL Ratio: 2.7 ratio (ref 0.0–4.4)
Cholesterol, Total: 149 mg/dL (ref 100–199)
HDL: 55 mg/dL (ref >39)
LDL Chol Calc (NIH): 74 mg/dL (ref 0–99)
Triglycerides: 108 mg/dL (ref 0–149)
VLDL Cholesterol Cal: 20 mg/dL (ref 5–40)

## 2024-10-30 ENCOUNTER — Encounter: Payer: Self-pay | Admitting: Nurse Practitioner

## 2024-10-30 MED ORDER — OLMESARTAN MEDOXOMIL 40 MG PO TABS: 40.0000 mg | ORAL_TABLET | Freq: Every day | ORAL | 0 refills | Status: AC

## 2024-11-01 ENCOUNTER — Other Ambulatory Visit: Payer: Self-pay | Admitting: Nurse Practitioner

## 2024-11-02 NOTE — Telephone Encounter (Signed)
 Requested Prescriptions  Pending Prescriptions Disp Refills   valACYclovir  (VALTREX ) 500 MG tablet [Pharmacy Med Name: VALACYCLOVIR  HCL 500 MG TABLET] 30 tablet 8    Sig: TAKE 1 TABLET (500 MG TOTAL) BY MOUTH DAILY.     Antimicrobials:  Antiviral Agents - Anti-Herpetic Passed - 11/02/2024  3:22 PM      Passed - Valid encounter within last 12 months    Recent Outpatient Visits           3 weeks ago Essential hypertension   [facility] Melvin Pao, NP

## 2024-11-20 ENCOUNTER — Emergency Department

## 2024-11-20 ENCOUNTER — Emergency Department
Admission: EM | Admit: 2024-11-20 | Discharge: 2024-11-20 | Disposition: A | Discharge status: Home / Self Care | Attending: Emergency Medicine | Admitting: Emergency Medicine

## 2024-11-20 DIAGNOSIS — F17200 Nicotine dependence, unspecified, uncomplicated: Secondary | ICD-10-CM | POA: Insufficient documentation

## 2024-11-20 DIAGNOSIS — R000 Tachycardia, unspecified: Secondary | ICD-10-CM | POA: Insufficient documentation

## 2024-11-20 DIAGNOSIS — I10 Essential (primary) hypertension: Secondary | ICD-10-CM | POA: Diagnosis not present

## 2024-11-20 DIAGNOSIS — R0789 Other chest pain: Secondary | ICD-10-CM | POA: Diagnosis present

## 2024-11-20 DIAGNOSIS — R079 Chest pain, unspecified: Secondary | ICD-10-CM

## 2024-11-20 LAB — CBC
HCT: 44.4 % (ref 36.0–46.0)
Hemoglobin: 15.2 g/dL — ABNORMAL HIGH (ref 12.0–15.0)
MCH: 34.5 pg — ABNORMAL HIGH (ref 26.0–34.0)
MCHC: 34.2 g/dL (ref 30.0–36.0)
MCV: 100.9 fL — ABNORMAL HIGH (ref 80.0–100.0)
Platelets: 283 K/uL (ref 150–400)
RBC: 4.4 MIL/uL (ref 3.87–5.11)
RDW: 12.6 % (ref 11.5–15.5)
WBC: 9.3 K/uL (ref 4.0–10.5)
nRBC: 0 % (ref 0.0–0.2)

## 2024-11-20 LAB — BASIC METABOLIC PANEL WITH GFR
Anion gap: 12 (ref 5–15)
BUN: 9 mg/dL (ref 6–20)
CO2: 26 mmol/L (ref 22–32)
Calcium: 9.6 mg/dL (ref 8.9–10.3)
Chloride: 100 mmol/L (ref 98–111)
Creatinine, Ser: 0.69 mg/dL (ref 0.44–1.00)
GFR, Estimated: >60 mL/min (ref >60)
Glucose, Bld: 101 mg/dL — ABNORMAL HIGH (ref 70–99)
Potassium: 3.6 mmol/L (ref 3.5–5.1)
Sodium: 138 mmol/L (ref 135–145)

## 2024-11-20 LAB — D-DIMER, QUANTITATIVE: D-Dimer, Quant: <0.27 ug{FEU}/mL (ref 0.00–0.50)

## 2024-11-20 LAB — POC URINE PREG, ED: Preg Test, Ur: NEGATIVE

## 2024-11-20 LAB — TROPONIN T, HIGH SENSITIVITY: Troponin T High Sensitivity: <15 ng/L (ref 0–19)

## 2024-11-20 NOTE — Discharge Instructions (Addendum)
 You are seen in the emergency department for chest pain.  Your chest x-ray did not show any signs of pneumonia.  Your EKG did not appear to be consistent with a heart attack.  Your heart enzymes were normal.  You had a screening test for blood clots and that was negative.  Your hemoglobin was mildly elevated today.  It is importantly you work on quitting smoking.  Follow-up closely with your primary care physician so they can recheck your blood pressure and make sure that you do not need a change of your home blood pressure medications.  Discussed possible outpatient cardiac follow-up.  Return to the emergency department for any ongoing or worsening symptoms.

## 2024-11-20 NOTE — ED Triage Notes (Signed)
 Pt presents to the ED via POV from home with sharp mid sternal chest pain since 1130 today that is intermittent. Pt states that it is a 0/10 at this time, but that it gets up to an 8/10. Pt denies associated sx's. Pt crying at time of triage. Hx of htn, states that she has taken her BP medication today. BP 182/120

## 2024-11-20 NOTE — ED Provider Notes (Addendum)
 [facility] Provider Note    Event Date/Time   First MD Initiated Contact with Patient 11/20/24 1523     (approximate)   History   Chest Pain   HPI  Whitney Rodriguez is a 38 y.o. female past medical history significant for hypertension, tobacco use, presents to the emergency department with chest pain.  States that she has had sharp stabbing chest pain that has been intermittent and started earlier today.  Has not noticed anything that improves or worsens the pain.  Nonpositional.  No change with eating.  No association with nausea, vomiting or shortness of breath.  No diaphoresis.  No similar episodes in the past.  States that she has a history of hypertension and had recently switched her amlodipine  to Benicar  secondary to lower extremity edema.  No history of DVT or PE.  Not on any estrogen or replacement.  Does endorse tobacco use.  No family history coronary artery disease at a young age.  Symptoms are not exertional.  No unexplained weight loss.  No significant cough, fever or chills.  No recent travel or surgery.     Physical Exam   Triage Vital Signs: ED Triage Vitals  Encounter Vitals Group     BP 11/20/24 1447 (!) 182/120     Girls Systolic BP Percentile --      Girls Diastolic BP Percentile --      Boys Systolic BP Percentile --      Boys Diastolic BP Percentile --      Pulse Rate 11/20/24 1447 (!) 106     Resp 11/20/24 1447 18     Temp 11/20/24 1447 99.3 F (37.4 C)     Temp Source 11/20/24 1447 Oral     SpO2 11/20/24 1447 100 %     Weight 11/20/24 1443 165 lb (74.8 kg)     Height 11/20/24 1443 5' 1 (1.549 m)     Head Circumference --      Peak Flow --      Pain Score 11/20/24 1443 0     Pain Loc --      Pain Education --      Exclude from Growth Chart --     Most recent vital signs: Vitals:   11/20/24 1447  BP: (!) 182/120  Pulse: (!) 106  Resp: 18  Temp: 99.3 F (37.4 C)  SpO2: 100%    Physical Exam Constitutional:       Appearance: She is well-developed.  HENT:     Head: Atraumatic.  Eyes:     Conjunctiva/sclera: Conjunctivae normal.  Cardiovascular:     Rate and Rhythm: Regular rhythm. Tachycardia present.     Pulses:          Radial pulses are 2+ on the right side and 2+ on the left side.       Dorsalis pedis pulses are 2+ on the right side and 2+ on the left side.     Heart sounds: Normal heart sounds. No murmur heard.    No friction rub.  Pulmonary:     Effort: No respiratory distress.     Breath sounds: Normal breath sounds.  Abdominal:     General: There is no distension.     Palpations: Abdomen is soft.  Musculoskeletal:        General: Normal range of motion.     Cervical back: Normal range of motion.     Right lower leg: No edema.     Left  lower leg: No edema.  Skin:    General: Skin is warm.  Neurological:     Mental Status: She is alert. Mental status is at baseline.     IMPRESSION / MDM / ASSESSMENT AND PLAN / ED COURSE  I reviewed the triage vital signs and the nursing notes.  Differential diagnosis including ACS, anemia, gastritis/PUD, vasospasm, pleurisy, pneumonia, pulmonary embolism.  Have a low suspicion for dissection, no tearing chest pain.  Low risk Wells criteria given her tachycardia is not PERC negative.  Will obtain a screening D-dimer   EKG  I, Clotilda Punter, the attending physician, personally viewed and interpreted this ECG.  Sinus tachycardia, no significant ST elevation or depression.  No findings of acute ischemia or dysrhythmia  RADIOLOGY I independently reviewed imaging, my interpretation of imaging: Chest x-ray no signs of pneumonia, no widened mediastinum and no pulmonary edema  LABS (all labs ordered are listed, but only abnormal results are displayed) Labs interpreted as -    Labs Reviewed  BASIC METABOLIC PANEL WITH GFR - Abnormal; Notable for the following components:      Result Value   Glucose, Bld 101 (*)    All other components within  normal limits  CBC - Abnormal; Notable for the following components:   Hemoglobin 15.2 (*)    MCV 100.9 (*)    MCH 34.5 (*)    All other components within normal limits  D-DIMER, QUANTITATIVE  POC URINE PREG, ED  TROPONIN T, HIGH SENSITIVITY  TROPONIN T, HIGH SENSITIVITY     MDM   Clinical Course as of 11/20/24 1707  Tue Nov 20, 2024  1704 D-dimer is negative, states she is feeling better.  Have a low suspicion for pulmonary embolism.  Discussed possible referral for outpatient cardiology given her risk factors of hypertension and tobacco use.  Patient declined at this time and states that she just wants to follow-up with her primary care physician and return if any symptoms return or worsen. [SM]  1706 Patient's blood pressure elevated in the emergency department.  Patient is already on Benicar  and has been taking this medication as prescribed and has been compliant with this medication.  Has plenty of refills at home.  Discussed that she needs close follow-up as an outpatient within the next 1 week to have her blood pressure rechecked and to discuss possible change of her antihypertensive medications.  States that she wants to follow-up with her primary care physician for blood pressure recheck.  Do not feel that she needs a change of her medication at this time.  No signs of endorgan damage. [SM]    Clinical Course User Index [SM] Punter Clotilda, MD     PROCEDURES:  Critical Care performed: No  Procedures  Patient's presentation is most consistent with acute presentation with potential threat to life or bodily function.   MEDICATIONS ORDERED IN ED: Medications - No data to display  FINAL CLINICAL IMPRESSION(S) / ED DIAGNOSES   Final diagnoses:  Chest pain, unspecified type  Uncontrolled hypertension     Rx / DC Orders   ED Discharge Orders     None        Note:  This document was prepared using Dragon voice recognition software and may include unintentional  dictation errors.   Punter Clotilda, MD 11/20/24 1705    Punter Clotilda, MD 11/20/24 [phone]
